# Patient Record
Sex: Male | Born: 1990 | Race: Black or African American | Hispanic: No | Marital: Single | Smoking: Current every day smoker
Health system: Southern US, Community
[De-identification: ages and names within clinical notes are randomized; demographics above are authoritative.]

## PROBLEM LIST (undated history)

## (undated) DIAGNOSIS — Z789 Other specified health status: Secondary | ICD-10-CM

## (undated) DIAGNOSIS — F209 Schizophrenia, unspecified: Secondary | ICD-10-CM

---
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
## 2010-07-10 ENCOUNTER — Emergency Department (HOSPITAL_COMMUNITY)
Admission: EM | Admit: 2010-07-10 | Discharge: 2010-07-10 | Disposition: A | Discharge status: Home / Self Care | Payer: Medicaid Other | Attending: Emergency Medicine | Admitting: Emergency Medicine

## 2010-07-10 ENCOUNTER — Emergency Department (HOSPITAL_COMMUNITY): Payer: Medicaid Other

## 2010-07-10 DIAGNOSIS — Y998 Other external cause status: Secondary | ICD-10-CM | POA: Insufficient documentation

## 2010-07-10 DIAGNOSIS — X58XXXA Exposure to other specified factors, initial encounter: Secondary | ICD-10-CM | POA: Insufficient documentation

## 2010-07-10 DIAGNOSIS — Y9389 Activity, other specified: Secondary | ICD-10-CM | POA: Insufficient documentation

## 2010-07-10 DIAGNOSIS — S63509A Unspecified sprain of unspecified wrist, initial encounter: Secondary | ICD-10-CM | POA: Insufficient documentation

## 2010-09-09 ENCOUNTER — Emergency Department (HOSPITAL_COMMUNITY)
Admission: EM | Admit: 2010-09-09 | Discharge: 2010-09-09 | Disposition: A | Discharge status: Home / Self Care | Payer: Medicaid Other | Attending: Emergency Medicine | Admitting: Emergency Medicine

## 2010-09-09 ENCOUNTER — Encounter: Payer: Self-pay | Admitting: *Deleted

## 2010-09-09 ENCOUNTER — Emergency Department (HOSPITAL_COMMUNITY): Payer: Medicaid Other

## 2010-09-09 DIAGNOSIS — R197 Diarrhea, unspecified: Secondary | ICD-10-CM | POA: Insufficient documentation

## 2010-09-09 DIAGNOSIS — F17200 Nicotine dependence, unspecified, uncomplicated: Secondary | ICD-10-CM | POA: Insufficient documentation

## 2010-09-09 DIAGNOSIS — R1084 Generalized abdominal pain: Secondary | ICD-10-CM

## 2010-09-09 DIAGNOSIS — R110 Nausea: Secondary | ICD-10-CM | POA: Insufficient documentation

## 2010-09-09 DIAGNOSIS — R109 Unspecified abdominal pain: Secondary | ICD-10-CM | POA: Insufficient documentation

## 2010-09-09 LAB — URINALYSIS, ROUTINE W REFLEX MICROSCOPIC
Ketones, ur: NEGATIVE mg/dL
Urobilinogen, UA: 0.2 mg/dL (ref 0.0–1.0)

## 2010-09-09 LAB — BASIC METABOLIC PANEL
CO2: 28 mEq/L (ref 19–32)
Calcium: 9.7 mg/dL (ref 8.4–10.5)
Creatinine, Ser: 0.92 mg/dL (ref 0.50–1.35)
GFR calc non Af Amer: >60 mL/min (ref >60)
Potassium: 3.4 mEq/L — ABNORMAL LOW (ref 3.5–5.1)
Sodium: 136 mEq/L (ref 135–145)

## 2010-09-09 LAB — CBC
MCH: 29.8 pg (ref 26.0–34.0)
MCHC: 34.2 g/dL (ref 30.0–36.0)
MCV: 87.2 fL (ref 78.0–100.0)
RDW: 12.9 % (ref 11.5–15.5)
WBC: 9.1 10*3/uL (ref 4.0–10.5)

## 2010-09-09 MED ORDER — SODIUM CHLORIDE 0.9 % IV SOLN
Freq: Once | INTRAVENOUS | Status: AC
  Administered 2010-09-09: 06:00:00 via INTRAVENOUS

## 2010-09-09 MED ORDER — PANTOPRAZOLE SODIUM 40 MG IV SOLR
40.0000 mg | Freq: Once | INTRAVENOUS | Status: AC
  Administered 2010-09-09: 40 mg via INTRAVENOUS
  Filled 2010-09-09: qty 40

## 2010-09-09 MED ORDER — ONDANSETRON HCL 4 MG/2ML IJ SOLN
4.0000 mg | Freq: Once | INTRAMUSCULAR | Status: AC
  Administered 2010-09-09: 4 mg via INTRAVENOUS
  Filled 2010-09-09: qty 2

## 2010-09-09 MED ORDER — MORPHINE SULFATE 2 MG/ML IJ SOLN
2.0000 mg | Freq: Once | INTRAMUSCULAR | Status: AC
  Administered 2010-09-09: 2 mg via INTRAVENOUS
  Filled 2010-09-09: qty 1

## 2010-09-09 NOTE — ED Notes (Signed)
Pt c/o abdominal pain, nausea, dizziness, and sob x 2days.

## 2010-09-09 NOTE — ED Provider Notes (Addendum)
History     CSN: [id number] Arrival date & time: 09/09/2010  5:11 AM  Chief Complaint  Patient presents with  . Abdominal Pain  . Shortness of Breath  . Nausea  . Dizziness   HPI Comments: Seen 0611.  Patient is a 20 y.o. male presenting with abdominal pain and shortness of breath.  Abdominal Pain The primary symptoms of the illness include abdominal pain, nausea and diarrhea. The primary symptoms of the illness do not include shortness of breath or vomiting. Episode onset: Has had intermittant abdominal pain x 3 days, worse tonight after eating at 1 AM. The onset of the illness was gradual. The problem has been gradually worsening.  The abdominal pain radiates to the periumbilical region. The severity of the abdominal pain is 6/10. The abdominal pain is relieved by nothing. The abdominal pain is exacerbated by deep breathing.  The nausea is associated with eating.  Symptoms associated with the illness do not include chills, constipation, urgency, hematuria, frequency or back pain.  Shortness of Breath  Pertinent negatives include no shortness of breath.    History reviewed. No pertinent past medical history.  History reviewed. No pertinent past surgical history.  Family History  Problem Relation Age of Onset  . Diabetes Mother     History  Substance Use Topics  . Smoking status: Current Everyday Smoker  . Smokeless tobacco: Not on file  . Alcohol Use: No      Review of Systems  Constitutional: Negative for chills.  Respiratory: Negative for shortness of breath.   Gastrointestinal: Positive for nausea, abdominal pain and diarrhea. Negative for vomiting and constipation.  Genitourinary: Negative for urgency, frequency and hematuria.  Musculoskeletal: Negative for back pain.  All other systems reviewed and are negative.    Physical Exam  BP 138/74  Pulse 100  Temp 97.5 F (36.4 C)  Resp 20  Ht 5\' 11"  (1.803 m)  Wt 169 lb (76.658 kg)  BMI 23.57 kg/m2  SpO2  99%  Physical Exam  Constitutional: He is oriented to person, place, and time. He appears well-developed and well-nourished.  HENT:  Head: Normocephalic and atraumatic.  Eyes: EOM are normal.  Neck: Neck supple.  Cardiovascular: Normal rate, normal heart sounds and intact distal pulses.   Pulmonary/Chest: Effort normal and breath sounds normal.  Abdominal: Soft. Bowel sounds are normal. He exhibits no mass. There is tenderness.       Mild tenderness to periumbilical area, no rebound, no guarding  Neurological: He is alert and oriented to person, place, and time.  Skin: Skin is warm and dry.    ED Course  Procedures  MDM Reviewed results with patient. Pt feels improved after observation and/or treatment in ED. MDM Reviewed: nursing note and vitals Interpretation: labs and x-ray        Nicoletta Dress. Colon Branch, MD 09/09/10 0745  Nicoletta Dress. Colon Branch, MD 09/09/10 [id number]

## 2010-09-09 NOTE — ED Notes (Signed)
Abd pain with nausea for 2 days.  Vomited x1 today, self induced.  Diarrhea x1  Today.

## 2010-09-09 NOTE — ED Notes (Signed)
Feels dizzy when stands

## 2010-09-13 ENCOUNTER — Emergency Department (HOSPITAL_COMMUNITY)
Admission: EM | Admit: 2010-09-13 | Discharge: 2010-09-13 | Disposition: A | Discharge status: Home / Self Care | Payer: Medicaid Other | Attending: Emergency Medicine | Admitting: Emergency Medicine

## 2010-09-13 ENCOUNTER — Emergency Department (HOSPITAL_COMMUNITY): Payer: Medicaid Other

## 2010-09-13 ENCOUNTER — Encounter (HOSPITAL_COMMUNITY): Payer: Self-pay | Admitting: *Deleted

## 2010-09-13 DIAGNOSIS — E119 Type 2 diabetes mellitus without complications: Secondary | ICD-10-CM | POA: Insufficient documentation

## 2010-09-13 DIAGNOSIS — F17200 Nicotine dependence, unspecified, uncomplicated: Secondary | ICD-10-CM | POA: Insufficient documentation

## 2010-09-13 DIAGNOSIS — S93409A Sprain of unspecified ligament of unspecified ankle, initial encounter: Secondary | ICD-10-CM | POA: Insufficient documentation

## 2010-09-13 DIAGNOSIS — Y9239 Other specified sports and athletic area as the place of occurrence of the external cause: Secondary | ICD-10-CM | POA: Insufficient documentation

## 2010-09-13 DIAGNOSIS — Y92838 Other recreation area as the place of occurrence of the external cause: Secondary | ICD-10-CM | POA: Insufficient documentation

## 2010-09-13 DIAGNOSIS — X500XXA Overexertion from strenuous movement or load, initial encounter: Secondary | ICD-10-CM | POA: Insufficient documentation

## 2010-09-13 MED ORDER — IBUPROFEN 800 MG PO TABS
800.0000 mg | ORAL_TABLET | Freq: Once | ORAL | Status: AC
  Administered 2010-09-13: 800 mg via ORAL

## 2010-09-13 MED ORDER — ONDANSETRON HCL 4 MG PO TABS
4.0000 mg | ORAL_TABLET | Freq: Once | ORAL | Status: AC
  Administered 2010-09-13: 4 mg via ORAL

## 2010-09-13 MED ORDER — IBUPROFEN 800 MG PO TABS: 800.0000 mg | ORAL_TABLET | Freq: Three times a day (TID) | ORAL | Status: AC

## 2010-09-13 MED ORDER — HYDROCODONE-ACETAMINOPHEN 5-325 MG PO TABS
2.0000 | ORAL_TABLET | Freq: Once | ORAL | Status: AC
  Administered 2010-09-13: 2 via ORAL

## 2010-09-13 MED ORDER — HYDROCODONE-ACETAMINOPHEN 5-325 MG PO TABS: ORAL_TABLET | ORAL | Status: DC

## 2010-09-13 NOTE — ED Notes (Signed)
Pt states he was playing basketball earlier this evening and twisted his right ankle

## 2010-09-13 NOTE — ED Notes (Signed)
Pt left er stating no needs 

## 2010-09-13 NOTE — ED Provider Notes (Signed)
History     CSN: [id number] Arrival date & time: 09/13/2010 11:01 PM  Chief Complaint  Patient presents with  . Ankle Pain   Patient is a 20 y.o. male presenting with ankle pain. The history is provided by the patient.  Ankle Pain  The incident occurred 1 to 2 hours ago. The incident occurred at the gym. Injury mechanism: twisting injury. The pain is present in the right ankle. The quality of the pain is described as sharp. The pain is at a severity of 7/10. The pain is moderate. The pain has been constant since onset. Associated symptoms include inability to bear weight. Pertinent negatives include no numbness and no loss of sensation. The symptoms are aggravated by bearing weight. He has tried nothing for the symptoms.    History reviewed. No pertinent past medical history.  History reviewed. No pertinent past surgical history.  Family History  Problem Relation Age of Onset  . Diabetes Mother     History  Substance Use Topics  . Smoking status: Current Everyday Smoker  . Smokeless tobacco: Not on file  . Alcohol Use: No      Review of Systems  Constitutional: Negative for activity change.       All ROS Neg except as noted in HPI  HENT: Negative for nosebleeds and neck pain.   Eyes: Negative for photophobia and discharge.  Respiratory: Negative for cough, shortness of breath and wheezing.   Cardiovascular: Negative for chest pain and palpitations.  Gastrointestinal: Negative for abdominal pain and blood in stool.  Genitourinary: Negative for dysuria, frequency and hematuria.  Musculoskeletal: Negative for back pain and arthralgias.  Skin: Negative.   Neurological: Negative for dizziness, seizures, speech difficulty and numbness.  Psychiatric/Behavioral: Negative for hallucinations and confusion.    Physical Exam  BP 113/66  Pulse 50  Temp(Src) 97.4 F (36.3 C) (Oral)  Resp 14  Ht 5\' 11"  (1.803 m)  Wt 169 lb (76.658 kg)  BMI 23.57 kg/m2  SpO2 100%  Physical  Exam  Nursing note and vitals reviewed. Constitutional: He is oriented to person, place, and time. He appears well-developed and well-nourished.  Non-toxic appearance.  HENT:  Head: Normocephalic.  Right Ear: Tympanic membrane and external ear normal.  Left Ear: Tympanic membrane and external ear normal.  Eyes: EOM and lids are normal. Pupils are equal, round, and reactive to light.  Neck: Normal range of motion. Neck supple. Carotid bruit is not present.  Cardiovascular: Normal rate, regular rhythm, normal heart sounds, intact distal pulses and normal pulses.   Pulmonary/Chest: Breath sounds normal. No respiratory distress.  Abdominal: Soft. Bowel sounds are normal. There is no tenderness. There is no guarding.  Musculoskeletal: Normal range of motion.       Pain and mild swelling of the lateral malleolus. Good cap refill. Pt can flex an extend all toes. Achilles intact. DP and PT wnl. FROM of the right knee and hip.  Lymphadenopathy:       Head (right side): No submandibular adenopathy present.       Head (left side): No submandibular adenopathy present.    He has no cervical adenopathy.  Neurological: He is alert and oriented to person, place, and time. He has normal strength. No cranial nerve deficit or sensory deficit.  Skin: Skin is warm and dry.  Psychiatric: He has a normal mood and affect. His speech is normal.    ED Course Plan discussed with pt. He verbalizes understanding of the plan.  Procedures Plan ASO,  crutches. Ice. Elevation. NSAID and narcotic pain med. MDM I have reviewed nursing notes, vital signs, and all appropriate lab and imaging results for this patient.      Kathie Dike, [place] 09/13/10 [phone]

## 2010-09-13 NOTE — ED Notes (Signed)
aso applied crutch education completed, pt hx of same injury and use of both devises

## 2010-09-25 ENCOUNTER — Emergency Department (HOSPITAL_COMMUNITY): Payer: Medicaid Other

## 2010-09-25 ENCOUNTER — Emergency Department (HOSPITAL_COMMUNITY)
Admission: EM | Admit: 2010-09-25 | Discharge: 2010-09-25 | Disposition: A | Discharge status: Home / Self Care | Payer: Medicaid Other | Attending: Emergency Medicine | Admitting: Emergency Medicine

## 2010-09-25 ENCOUNTER — Encounter (HOSPITAL_COMMUNITY): Payer: Self-pay | Admitting: Emergency Medicine

## 2010-09-25 DIAGNOSIS — X500XXA Overexertion from strenuous movement or load, initial encounter: Secondary | ICD-10-CM | POA: Insufficient documentation

## 2010-09-25 DIAGNOSIS — F17200 Nicotine dependence, unspecified, uncomplicated: Secondary | ICD-10-CM | POA: Insufficient documentation

## 2010-09-25 DIAGNOSIS — M25469 Effusion, unspecified knee: Secondary | ICD-10-CM | POA: Insufficient documentation

## 2010-09-25 DIAGNOSIS — M25569 Pain in unspecified knee: Secondary | ICD-10-CM | POA: Insufficient documentation

## 2010-09-25 MED ORDER — IBUPROFEN 800 MG PO TABS: 800.0000 mg | ORAL_TABLET | Freq: Three times a day (TID) | ORAL | Status: AC

## 2010-09-25 MED ORDER — IBUPROFEN 800 MG PO TABS
800.0000 mg | ORAL_TABLET | Freq: Once | ORAL | Status: AC
Start: 2010-09-25 — End: 2010-09-25
  Administered 2010-09-25: 800 mg via ORAL
  Filled 2010-09-25: qty 1

## 2010-09-25 NOTE — ED Provider Notes (Signed)
History     CSN: [id number] Arrival date & time: 09/25/2010  1:12 AM  Chief Complaint  Patient presents with  . Knee Pain   HPI Comments: Seen 0222. Patient states he was lifting a heavy object when he twisted his left knee. He heard a pop and had pain to the knee. Now each time he bends his knee, he experiences discomfort,   Patient is a 20 y.o. male presenting with knee pain. The history is provided by the patient.  Knee Pain This is a new problem. The current episode started 6 to 12 hours ago. The problem occurs constantly. The problem has not changed since onset.The symptoms are aggravated by walking and bending. The symptoms are relieved by nothing. He has tried nothing for the symptoms.    History reviewed. No pertinent past medical history.  History reviewed. No pertinent past surgical history.  Family History  Problem Relation Age of Onset  . Diabetes Mother     History  Substance Use Topics  . Smoking status: Current Everyday Smoker -- 0.5 packs/day    Types: Cigarettes  . Smokeless tobacco: Not on file  . Alcohol Use: No      Review of Systems  All other systems reviewed and are negative.    Physical Exam  BP 123/73  Pulse 74  Temp(Src) 98.5 F (36.9 C) (Oral)  Resp 18  Ht 5\' 11"  (1.803 m)  Wt 169 lb (76.658 kg)  BMI 23.57 kg/m2  SpO2 100%  Physical Exam  Nursing note and vitals reviewed. Constitutional: He is oriented to person, place, and time. He appears well-developed and well-nourished.  HENT:  Head: Normocephalic and atraumatic.  Eyes: EOM are normal.  Neck: Normal range of motion. Neck supple.  Cardiovascular: Normal rate.   Pulmonary/Chest: Effort normal.  Abdominal: Soft.  Musculoskeletal: Normal range of motion.       Left knee with mild effusion and mild crepitus. FROM.  Neurological: He is alert and oriented to person, place, and time. He has normal reflexes.  Skin: Skin is warm and dry.    ED Course  Procedures  Patient with  knee pain due to twisting motion while lifting heavy object. Patient with mild effusion. Placed in a knee immobilizer and given ibuprofen. Pt stable in ED with no significant deterioration in condition.Referral to ortho should pain continue.  Dg Knee Complete 4 Views Left  09/25/2010  *RADIOLOGY REPORT*  Clinical Data: Injury.  Lifting object.  Knee twisted and popped.  LEFT KNEE - COMPLETE 4+ VIEW  Comparison: None.  Findings: There is a joint effusion.  No evidence for acute fracture or subluxation. If pain persists, consider follow-up MRI to evaluate for internal derangement.  IMPRESSION: Joint effusion.  Original Report Authenticated By: Patterson Hammersmith, M.D.   MDM Reviewed: nursing note and vitals Interpretation: x-ray      Nicoletta Dress. Colon Branch, MD 09/25/10 [phone]

## 2010-09-25 NOTE — ED Notes (Signed)
Patient states was lifting something and bent his left knee the wrong way and now c/o increased pain.

## 2010-10-15 ENCOUNTER — Emergency Department (HOSPITAL_COMMUNITY)
Admission: EM | Admit: 2010-10-15 | Discharge: 2010-10-15 | Disposition: A | Discharge status: Home / Self Care | Payer: Medicaid Other | Attending: Emergency Medicine | Admitting: Emergency Medicine

## 2010-10-15 ENCOUNTER — Encounter (HOSPITAL_COMMUNITY): Payer: Self-pay | Admitting: *Deleted

## 2010-10-15 ENCOUNTER — Emergency Department (HOSPITAL_COMMUNITY): Payer: Medicaid Other

## 2010-10-15 DIAGNOSIS — S8392XA Sprain of unspecified site of left knee, initial encounter: Secondary | ICD-10-CM

## 2010-10-15 DIAGNOSIS — Z880 Allergy status to penicillin: Secondary | ICD-10-CM | POA: Insufficient documentation

## 2010-10-15 DIAGNOSIS — IMO0002 Reserved for concepts with insufficient information to code with codable children: Secondary | ICD-10-CM | POA: Insufficient documentation

## 2010-10-15 DIAGNOSIS — F17200 Nicotine dependence, unspecified, uncomplicated: Secondary | ICD-10-CM | POA: Insufficient documentation

## 2010-10-15 MED ORDER — TETANUS-DIPHTH-ACELL PERTUSSIS 5-2.5-18.5 LF-MCG/0.5 IM SUSP
0.5000 mL | Freq: Once | INTRAMUSCULAR | Status: AC
  Administered 2010-10-15: 0.5 mL via INTRAMUSCULAR
  Filled 2010-10-15: qty 0.5

## 2010-10-15 MED ORDER — OXYCODONE-ACETAMINOPHEN 5-325 MG PO TABS: 1.0000 | ORAL_TABLET | ORAL | Status: AC | PRN

## 2010-10-15 MED ORDER — BACITRACIN-NEOMYCIN-POLYMYXIN 400-5-5000 EX OINT
TOPICAL_OINTMENT | Freq: Once | CUTANEOUS | Status: AC
  Administered 2010-10-15: 04:00:00 via TOPICAL
  Filled 2010-10-15: qty 1

## 2010-10-15 MED ORDER — OXYCODONE-ACETAMINOPHEN 5-325 MG PO TABS
1.0000 | ORAL_TABLET | Freq: Once | ORAL | Status: AC
  Administered 2010-10-15: 1 via ORAL
  Filled 2010-10-15: qty 1

## 2010-10-15 NOTE — ED Notes (Signed)
Pt c/o left knee pain.
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
## 2010-10-24 NOTE — ED Provider Notes (Signed)
Medical screening examination/treatment/procedure(s) were performed by non-physician practitioner and as supervising physician I was immediately available for consultation/collaboration.  Nicoletta Dress. Colon Branch, MD 10/24/10 [phone]

## 2010-11-04 DIAGNOSIS — F17200 Nicotine dependence, unspecified, uncomplicated: Secondary | ICD-10-CM | POA: Insufficient documentation

## 2010-11-04 DIAGNOSIS — M25569 Pain in unspecified knee: Secondary | ICD-10-CM | POA: Insufficient documentation

## 2010-11-04 DIAGNOSIS — X58XXXA Exposure to other specified factors, initial encounter: Secondary | ICD-10-CM | POA: Insufficient documentation

## 2010-11-05 ENCOUNTER — Encounter (HOSPITAL_COMMUNITY): Payer: Self-pay | Admitting: *Deleted

## 2010-11-05 ENCOUNTER — Emergency Department (HOSPITAL_COMMUNITY)
Admission: EM | Admit: 2010-11-05 | Discharge: 2010-11-05 | Disposition: A | Discharge status: Home / Self Care | Payer: Medicaid Other | Attending: Emergency Medicine | Admitting: Emergency Medicine

## 2010-11-05 ENCOUNTER — Emergency Department (HOSPITAL_COMMUNITY): Payer: Medicaid Other

## 2010-11-05 DIAGNOSIS — M25569 Pain in unspecified knee: Secondary | ICD-10-CM

## 2010-11-05 MED ORDER — IBUPROFEN 800 MG PO TABS
800.0000 mg | ORAL_TABLET | Freq: Once | ORAL | Status: AC
  Administered 2010-11-05: 800 mg via ORAL
  Filled 2010-11-05: qty 1

## 2010-11-05 MED ORDER — IBUPROFEN 800 MG PO TABS: 800.0000 mg | ORAL_TABLET | Freq: Three times a day (TID) | ORAL | Status: AC

## 2010-11-05 NOTE — ED Notes (Signed)
Pt reports he has had pain in rt knee for a while, reports he was playing football today and now his rt knee hurts worse

## 2010-11-05 NOTE — ED Provider Notes (Signed)
History     CSN: [id number] Arrival date & time: 11/05/2010 12:12 AM  Chief Complaint  Patient presents with  . Knee Pain    (Consider location/radiation/quality/duration/timing/severity/associated sxs/prior treatment) HPI Comments: Seen 0044. Patient with knee pain since playing football with a church group. He has not taken any medicines or measures to improve the pain. It hurts worse when he is walking.  Patient is a 20 y.o. male presenting with knee pain. The history is provided by the patient.  Knee Pain This is a new problem. The current episode started 6 to 12 hours ago (Patient was playing foot ball with his curhch groun and developed left knee pain.). The problem occurs constantly. The problem has not changed since onset.Pertinent negatives include no chest pain, no abdominal pain, no headaches and no shortness of breath. Exacerbated by: walking. The symptoms are relieved by nothing. He has tried nothing for the symptoms.    History reviewed. No pertinent past medical history.  History reviewed. No pertinent past surgical history.  Family History  Problem Relation Age of Onset  . Diabetes Mother     History  Substance Use Topics  . Smoking status: Current Everyday Smoker -- 0.5 packs/day    Types: Cigarettes  . Smokeless tobacco: Not on file  . Alcohol Use: No      Review of Systems  Respiratory: Negative for shortness of breath.   Cardiovascular: Negative for chest pain.  Gastrointestinal: Negative for abdominal pain.  Neurological: Negative for headaches.  All other systems reviewed and are negative.    Allergies  Penicillins  Home Medications  No current outpatient prescriptions on file.  BP 115/63  Pulse 74  Temp(Src) 98.3 F (36.8 C) (Oral)  Resp 20  Ht 5\' 11"  (1.803 m)  Wt 166 lb 11.2 oz (75.615 kg)  BMI 23.25 kg/m2  SpO2 99%  Physical Exam  Nursing note and vitals reviewed. Constitutional: He is oriented to person, place, and time. He  appears well-developed and well-nourished. No distress.  HENT:  Head: Normocephalic and atraumatic.  Eyes: EOM are normal.  Neck: Normal range of motion.  Cardiovascular: Normal rate, normal heart sounds and intact distal pulses.   Pulmonary/Chest: Effort normal and breath sounds normal.  Musculoskeletal:       Left knee with FROM, no effusion, no crepitus, no deformity. Pulses 2+.  Neurological: He is alert and oriented to person, place, and time. He has normal reflexes.  Skin: Skin is warm and dry.    ED Course  Procedures (including critical care time)  Labs Reviewed - No data to display Dg Knee Complete 4 Views Left  11/05/2010  *RADIOLOGY REPORT*  Clinical Data: Left knee pain and stiffness after playing football, no specific injury  LEFT KNEE - COMPLETE 4+ VIEW  Comparison: 10/15/2010  Findings: Bone mineralization normal. Joint spaces preserved. No fracture, dislocation, or bone destruction. No joint effusion.  IMPRESSION: Normal exam.  Original Report Authenticated By: Lollie Marrow, M.D.       MDM  Patient with knee injury playing football. PE is unremarkable, Xray is normal. Reviewed film and use of antiinflammatories with the patient.Pt stable in ED with no significant deterioration in condition. MDM Reviewed: nursing note and vitals Interpretation: x-ray           Nicoletta Dress. Colon Branch, MD 11/05/10 [phone]

## 2011-02-20 ENCOUNTER — Emergency Department (HOSPITAL_COMMUNITY)
Admission: EM | Admit: 2011-02-20 | Discharge: 2011-02-21 | Disposition: A | Discharge status: Home / Self Care | Payer: Medicaid Other | Attending: Emergency Medicine | Admitting: Emergency Medicine

## 2011-02-20 ENCOUNTER — Encounter (HOSPITAL_COMMUNITY): Payer: Self-pay | Admitting: *Deleted

## 2011-02-20 ENCOUNTER — Emergency Department (HOSPITAL_COMMUNITY): Payer: Medicaid Other

## 2011-02-20 DIAGNOSIS — S93401A Sprain of unspecified ligament of right ankle, initial encounter: Secondary | ICD-10-CM

## 2011-02-20 DIAGNOSIS — W108XXA Fall (on) (from) other stairs and steps, initial encounter: Secondary | ICD-10-CM | POA: Insufficient documentation

## 2011-02-20 DIAGNOSIS — F17200 Nicotine dependence, unspecified, uncomplicated: Secondary | ICD-10-CM | POA: Insufficient documentation

## 2011-02-20 DIAGNOSIS — S93409A Sprain of unspecified ligament of unspecified ankle, initial encounter: Secondary | ICD-10-CM | POA: Insufficient documentation

## 2011-02-20 DIAGNOSIS — M25476 Effusion, unspecified foot: Secondary | ICD-10-CM | POA: Insufficient documentation

## 2011-02-20 DIAGNOSIS — M25473 Effusion, unspecified ankle: Secondary | ICD-10-CM | POA: Insufficient documentation

## 2011-02-20 DIAGNOSIS — M25579 Pain in unspecified ankle and joints of unspecified foot: Secondary | ICD-10-CM | POA: Insufficient documentation

## 2011-02-20 MED ORDER — HYDROCODONE-ACETAMINOPHEN 5-325 MG PO TABS
2.0000 | ORAL_TABLET | Freq: Once | ORAL | Status: AC
  Administered 2011-02-20: 2 via ORAL
  Filled 2011-02-20: qty 2

## 2011-02-20 MED ORDER — IBUPROFEN 800 MG PO TABS: 800.0000 mg | ORAL_TABLET | Freq: Three times a day (TID) | ORAL | Status: AC

## 2011-02-20 MED ORDER — ONDANSETRON HCL 4 MG PO TABS
4.0000 mg | ORAL_TABLET | Freq: Once | ORAL | Status: AC
  Administered 2011-02-20: 4 mg via ORAL
  Filled 2011-02-20: qty 1

## 2011-02-20 MED ORDER — IBUPROFEN 800 MG PO TABS
800.0000 mg | ORAL_TABLET | Freq: Once | ORAL | Status: AC
  Administered 2011-02-20: 800 mg via ORAL
  Filled 2011-02-20: qty 1

## 2011-02-20 MED ORDER — HYDROCODONE-ACETAMINOPHEN 5-325 MG PO TABS: 1.0000 | ORAL_TABLET | ORAL | Status: AC | PRN

## 2011-02-20 NOTE — ED Provider Notes (Signed)
History     CSN: [id number]  Arrival date & time 02/20/11  2235   First MD Initiated Contact with Patient 02/20/11 2305      Chief Complaint  Patient presents with  . Ankle Pain    (Consider location/radiation/quality/duration/timing/severity/associated sxs/prior treatment) Patient is a 21 y.o. male presenting with ankle pain. The history is provided by the patient.  Ankle Pain  The incident occurred 1 to 2 hours ago. The injury mechanism was a fall. The pain is present in the right ankle. The pain is moderate. The pain has been worsening since onset. Associated symptoms include inability to bear weight. Pertinent negatives include no numbness. He reports no foreign bodies present. The symptoms are aggravated by bearing weight. He has tried nothing for the symptoms.    History reviewed. No pertinent past medical history.  History reviewed. No pertinent past surgical history.  Family History  Problem Relation Age of Onset  . Diabetes Mother     History  Substance Use Topics  . Smoking status: Current Everyday Smoker -- 0.5 packs/day    Types: Cigarettes  . Smokeless tobacco: Not on file  . Alcohol Use: No      Review of Systems  Constitutional: Negative for activity change.       All ROS Neg except as noted in HPI  HENT: Negative for nosebleeds and neck pain.   Eyes: Negative for photophobia and discharge.  Respiratory: Negative for cough, shortness of breath and wheezing.   Cardiovascular: Negative for chest pain and palpitations.  Gastrointestinal: Negative for abdominal pain and blood in stool.  Genitourinary: Negative for dysuria, frequency and hematuria.  Musculoskeletal: Negative for back pain and arthralgias.  Skin: Negative.   Neurological: Negative for dizziness, seizures, speech difficulty and numbness.  Psychiatric/Behavioral: Negative for hallucinations and confusion.    Allergies  Penicillins  Home Medications  No current outpatient prescriptions  on file.  BP 109/68  Pulse 100  Temp(Src) 98.2 F (36.8 C) (Oral)  Resp 20  Ht 5\' 10"  (1.778 m)  Wt 165 lb (74.844 kg)  BMI 23.68 kg/m2  SpO2 100%  Physical Exam  Nursing note and vitals reviewed. Constitutional: He is oriented to person, place, and time. He appears well-developed and well-nourished.  Non-toxic appearance.  HENT:  Head: Normocephalic.  Right Ear: Tympanic membrane and external ear normal.  Left Ear: Tympanic membrane and external ear normal.  Eyes: EOM and lids are normal. Pupils are equal, round, and reactive to light.  Neck: Normal range of motion. Neck supple. Carotid bruit is not present.  Cardiovascular: Normal rate, regular rhythm, normal heart sounds, intact distal pulses and normal pulses.   Pulmonary/Chest: Breath sounds normal. No respiratory distress.  Abdominal: Soft. Bowel sounds are normal. There is no tenderness. There is no guarding.  Musculoskeletal: Normal range of motion.       Pain and swelling of the right lateral malleolus. Dorsalis pedis and posterior tibial pulses are intact. Achilles tendon is intact. Good capillary refill noted. Full range of motion of the right knee and hip.  Lymphadenopathy:       Head (right side): No submandibular adenopathy present.       Head (left side): No submandibular adenopathy present.    He has no cervical adenopathy.  Neurological: He is alert and oriented to person, place, and time. He has normal strength. No cranial nerve deficit or sensory deficit.  Skin: Skin is warm and dry.  Psychiatric: He has a normal mood and affect. His  speech is normal.    ED Course  Procedures (including critical care time) Pulse oximetry 100% on room air. Within normal limits by my interpretation. Labs Reviewed - No data to display Dg Ankle Complete Right  02/20/2011  *RADIOLOGY REPORT*  Clinical Data: Right lateral ankle pain and swelling after fall  RIGHT ANKLE - COMPLETE 3+ VIEW  Comparison: 09/13/2010  Findings: Prompt  lateral soft tissue swelling.  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  No evidence of acute fracture or subluxation.  The ankle mortis and talar dome appear intact.  No focal bone lesion or bone destruction.  Bone cortex and trabecular architecture appear intact.  No abnormal periosteal reaction.  No radiopaque foreign bodies in the soft tissues.  IMPRESSION: Lateral soft tissue swelling.  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  No evidence of acute fracture or subluxation.  Original Report Authenticated By: Marlon Pel, M.D.     Dx: right ankle sprain.   MDM  I have reviewed nursing notes, vital signs, and all appropriate lab and imaging results for this patient. Negative x-ray report discussed with the patient. Patient fitted with an ankle splint, crutches and ice pack daily. Prescription for ibuprofen 800, and Norco 5 mg given to the patient. Patient is to see the orthopedist if not improving.       Kathie Dike, [place] 02/20/11 [phone]

## 2011-02-20 NOTE — ED Notes (Signed)
Pt reports he was attempting a front flip and rolled his rt ankle, reports he heard a "pop"

## 2011-02-21 NOTE — ED Notes (Signed)
Discharge instructions given and reviewed with patient.  Prescriptions given for Hydrocodone and Motrin; effects and use explained.  Patient verbalized understanding of sedating effects of Hydrocodone and to take Motrin with food.  Crutch fitting and teaching completed by A Dillard, EDT. Patient ambulatory with assistance of crutches.  Friend accompanied discharge to drive home.

## 2011-02-23 NOTE — ED Provider Notes (Signed)
Medical screening examination/treatment/procedure(s) were performed by non-physician practitioner and as supervising physician I was immediately available for consultation/collaboration.   Laray Anger, DO 02/23/11 [phone]

## 2011-07-22 ENCOUNTER — Emergency Department (HOSPITAL_COMMUNITY)
Admission: EM | Admit: 2011-07-22 | Discharge: 2011-07-22 | Disposition: A | Discharge status: Home / Self Care | Payer: Medicaid Other | Attending: Emergency Medicine | Admitting: Emergency Medicine

## 2011-07-22 ENCOUNTER — Encounter (HOSPITAL_COMMUNITY): Payer: Self-pay

## 2011-07-22 DIAGNOSIS — L089 Local infection of the skin and subcutaneous tissue, unspecified: Secondary | ICD-10-CM

## 2011-07-22 DIAGNOSIS — F17200 Nicotine dependence, unspecified, uncomplicated: Secondary | ICD-10-CM | POA: Insufficient documentation

## 2011-07-22 DIAGNOSIS — L0889 Other specified local infections of the skin and subcutaneous tissue: Secondary | ICD-10-CM | POA: Insufficient documentation

## 2011-07-22 MED ORDER — SULFAMETHOXAZOLE-TRIMETHOPRIM 200-40 MG/5ML PO SUSP: 20.0000 mL | Freq: Once | ORAL | Status: DC

## 2011-07-22 MED ORDER — SULFAMETHOXAZOLE-TRIMETHOPRIM 800-160 MG PO TABS: 1.0000 | ORAL_TABLET | Freq: Two times a day (BID) | ORAL | Status: AC

## 2011-07-22 MED ORDER — SULFAMETHOXAZOLE-TMP DS 800-160 MG PO TABS
1.0000 | ORAL_TABLET | Freq: Once | ORAL | Status: AC
  Administered 2011-07-22: 1 via ORAL
  Filled 2011-07-22: qty 1

## 2011-07-22 NOTE — ED Provider Notes (Signed)
History     CSN: [id number]  Arrival date & time 07/22/11  0321   First MD Initiated Contact with Patient 07/22/11 0354      Chief Complaint  Patient presents with  . Recurrent Skin Infections    (Consider location/radiation/quality/duration/timing/severity/associated sxs/prior treatment) HPI Thomas Combs is a 21 y.o. male who presents to the Emergency Department complaining of infected pimple to his right cheek. Patient with acne who had a small raised red area two days ago that he squeezed and is now larger, erythematous and painful. Denies fever, chills.  History reviewed. No pertinent past medical history.  History reviewed. No pertinent past surgical history.  Family History  Problem Relation Age of Onset  . Diabetes Mother     History  Substance Use Topics  . Smoking status: Current Everyday Smoker -- 0.5 packs/day    Types: Cigarettes  . Smokeless tobacco: Not on file  . Alcohol Use: No      Review of Systems  Constitutional: Negative for fever.       10 Systems reviewed and are negative for acute change except as noted in the HPI.  HENT: Negative for congestion.   Eyes: Negative for discharge and redness.  Respiratory: Negative for cough and shortness of breath.   Cardiovascular: Negative for chest pain.  Gastrointestinal: Negative for vomiting and abdominal pain.  Musculoskeletal: Negative for back pain.  Skin: Negative for rash.       Infected pimple  Neurological: Negative for syncope, numbness and headaches.  Psychiatric/Behavioral:       No behavior change.    Allergies  Penicillins  Home Medications   Current Outpatient Rx  Name Route Sig Dispense Refill  . SULFAMETHOXAZOLE-TRIMETHOPRIM 800-160 MG PO TABS Oral Take 1 tablet by mouth 2 (two) times daily. 28 tablet 0    BP 126/58  Pulse 58  Temp 97.9 F (36.6 C) (Oral)  Resp 16  Ht 5\' 10"  (1.778 m)  Wt 160 lb (72.576 kg)  BMI 22.96 kg/m2  SpO2 99%  Physical Exam  Nursing note and  vitals reviewed. Constitutional: He appears well-developed and well-nourished.       Awake, alert, nontoxic appearance.  HENT:  Head: Atraumatic.  Eyes: Right eye exhibits no discharge. Left eye exhibits no discharge.  Neck: Neck supple.  Cardiovascular: Normal rate and normal heart sounds.   Pulmonary/Chest: Effort normal. He exhibits no tenderness.  Abdominal: Soft. There is no tenderness. There is no rebound.  Musculoskeletal: He exhibits no tenderness.       Baseline ROM, no obvious new focal weakness.  Neurological:       Mental status and motor strength appears baseline for patient and situation.  Skin: No rash noted.       Facial acne with an inflamed comedone to right cheek with surrounding erythema and swelling, infraorbital area. No fluctuance.   Psychiatric: He has a normal mood and affect.    ED Course  Procedures (including critical care time)    1. Skin infection       MDM  Patient with facial acne presents with inflamed comedone.Initiated antibiotic therapy. Pt stable in ED with no significant deterioration in condition.The patient appears reasonably screened and/or stabilized for discharge and I doubt any other medical condition or other [facility] requiring further screening, evaluation, or treatment in the ED at this time prior to discharge.  MDM Reviewed: nursing note and vitals           Nicoletta Dress. Colon Branch, MD 07/22/11  2056 

## 2011-07-22 NOTE — ED Notes (Signed)
Boil/swelling to right cheek area, states has gotten bigger and more redness

## 2011-07-22 NOTE — Discharge Instructions (Signed)
Apply hot compresses to the area several times a day.Take all of the antibiotic. Use tylenol or ibubrofen for discomfort.    Skin Infections A skin infection usually develops as a result of disruption of the skin barrier.  CAUSES  A skin infection might occur following:  Trauma or an injury to the skin such as a cut or insect sting.   Inflammation (as in eczema).   Breaks in the skin between the toes (as in athlete's foot).   Swelling (edema).  SYMPTOMS  The legs are the most common site affected. Usually there is:  Redness.   Swelling.   Pain.   There may be red streaks in the area of the infection.  TREATMENT   Minor skin infections may be treated with topical antibiotics, but if the skin infection is severe, hospital care and intravenous (IV) antibiotic treatment may be needed.   Most often skin infections can be treated with oral antibiotic medicine as well as proper rest and elevation of the affected area until the infection improves.   If you are prescribed oral antibiotics, it is important to take them as directed and to take all the pills even if you feel better before you have finished all of the medicine.   You may apply warm compresses to the area for 20-30 minutes 4 times daily.  You might need a tetanus shot now if:  You have no idea when you had the last one.   You have never had a tetanus shot before.   Your wound had dirt in it.  If you need a tetanus shot and you decide not to get one, there is a rare chance of getting tetanus. Sickness from tetanus can be serious. If you get a tetanus shot, your arm may swell and become red and warm at the shot site. This is common and not a problem. SEEK MEDICAL CARE IF:  The pain and swelling from your infection do not improve within 2 days.  SEEK IMMEDIATE MEDICAL CARE IF:  You develop a fever, chills, or other serious problems.  Document Released: 02/16/2004 Document Revised: 12/28/2010 Document Reviewed:  12/29/2007 [facility] Patient Information 2012 [place], [place].
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
## 2017-03-11 ENCOUNTER — Other Ambulatory Visit: Payer: Self-pay

## 2017-03-11 ENCOUNTER — Encounter (HOSPITAL_COMMUNITY): Payer: Self-pay | Admitting: Emergency Medicine

## 2017-03-11 DIAGNOSIS — Z5321 Procedure and treatment not carried out due to patient leaving prior to being seen by health care provider: Secondary | ICD-10-CM | POA: Insufficient documentation

## 2017-03-11 DIAGNOSIS — R109 Unspecified abdominal pain: Secondary | ICD-10-CM | POA: Insufficient documentation

## 2017-03-11 LAB — CBC
HCT: 47.1 % (ref 39.0–52.0)
Hemoglobin: 15.8 g/dL (ref 13.0–17.0)
MCH: 30.7 pg (ref 26.0–34.0)
MCHC: 33.5 g/dL (ref 30.0–36.0)
MCV: 91.5 fL (ref 78.0–100.0)
PLATELETS: 137 10*3/uL — AB (ref 150–400)
RBC: 5.15 MIL/uL (ref 4.22–5.81)
RDW: 13.3 % (ref 11.5–15.5)
WBC: 7.9 10*3/uL (ref 4.0–10.5)

## 2017-03-11 LAB — COMPREHENSIVE METABOLIC PANEL
ALK PHOS: 66 U/L (ref 38–126)
ALT: 29 U/L (ref 17–63)
ANION GAP: 12 (ref 5–15)
AST: 23 U/L (ref 15–41)
Albumin: 4.1 g/dL (ref 3.5–5.0)
BILIRUBIN TOTAL: 0.4 mg/dL (ref 0.3–1.2)
BUN: 8 mg/dL (ref 6–20)
CALCIUM: 9.3 mg/dL (ref 8.9–10.3)
CO2: 24 mmol/L (ref 22–32)
CREATININE: 0.84 mg/dL (ref 0.61–1.24)
Chloride: 102 mmol/L (ref 101–111)
GFR calc non Af Amer: >60 mL/min (ref >60)
GLUCOSE: 111 mg/dL — AB (ref 65–99)
Potassium: 3.4 mmol/L — ABNORMAL LOW (ref 3.5–5.1)
SODIUM: 138 mmol/L (ref 135–145)
Total Protein: 7.4 g/dL (ref 6.5–8.1)

## 2017-03-11 LAB — LIPASE, BLOOD: Lipase: 33 U/L (ref 11–51)

## 2017-03-11 NOTE — ED Triage Notes (Signed)
Pt c/o generalized abd pain since yesterday. Pt denies any n/v/d and states last normal bowel movement was yesterday.

## 2017-03-12 ENCOUNTER — Other Ambulatory Visit: Payer: Self-pay

## 2017-03-12 ENCOUNTER — Emergency Department (HOSPITAL_COMMUNITY)
Admission: EM | Admit: 2017-03-12 | Discharge: 2017-03-12 | Disposition: A | Discharge status: Home / Self Care | Payer: Self-pay | Attending: Emergency Medicine | Admitting: Emergency Medicine

## 2017-03-12 ENCOUNTER — Emergency Department (HOSPITAL_COMMUNITY): Payer: Self-pay

## 2017-03-12 ENCOUNTER — Encounter (HOSPITAL_COMMUNITY): Payer: Self-pay | Admitting: *Deleted

## 2017-03-12 DIAGNOSIS — F17210 Nicotine dependence, cigarettes, uncomplicated: Secondary | ICD-10-CM | POA: Insufficient documentation

## 2017-03-12 DIAGNOSIS — R1084 Generalized abdominal pain: Secondary | ICD-10-CM | POA: Insufficient documentation

## 2017-03-12 LAB — COMPREHENSIVE METABOLIC PANEL
ALT: 30 U/L (ref 17–63)
AST: 29 U/L (ref 15–41)
Albumin: 4.1 g/dL (ref 3.5–5.0)
Alkaline Phosphatase: 64 U/L (ref 38–126)
Anion gap: 11 (ref 5–15)
BUN: 9 mg/dL (ref 6–20)
CHLORIDE: 101 mmol/L (ref 101–111)
CO2: 26 mmol/L (ref 22–32)
Calcium: 9.1 mg/dL (ref 8.9–10.3)
Creatinine, Ser: 0.95 mg/dL (ref 0.61–1.24)
GFR calc non Af Amer: >60 mL/min (ref >60)
Glucose, Bld: 128 mg/dL — ABNORMAL HIGH (ref 65–99)
POTASSIUM: 3.8 mmol/L (ref 3.5–5.1)
SODIUM: 138 mmol/L (ref 135–145)
Total Bilirubin: 0.5 mg/dL (ref 0.3–1.2)
Total Protein: 7.4 g/dL (ref 6.5–8.1)

## 2017-03-12 LAB — CBC
HEMATOCRIT: 46 % (ref 39.0–52.0)
HEMOGLOBIN: 15.3 g/dL (ref 13.0–17.0)
MCH: 30.5 pg (ref 26.0–34.0)
MCHC: 33.3 g/dL (ref 30.0–36.0)
MCV: 91.6 fL (ref 78.0–100.0)
Platelets: 150 10*3/uL (ref 150–400)
RBC: 5.02 MIL/uL (ref 4.22–5.81)
RDW: 13.5 % (ref 11.5–15.5)
WBC: 8.7 10*3/uL (ref 4.0–10.5)

## 2017-03-12 LAB — LIPASE, BLOOD: Lipase: 30 U/L (ref 11–51)

## 2017-03-12 MED ORDER — SODIUM CHLORIDE 0.9 % IV BOLUS (SEPSIS)
1000.0000 mL | Freq: Once | INTRAVENOUS | Status: AC
  Administered 2017-03-12: 1000 mL via INTRAVENOUS

## 2017-03-12 MED ORDER — RANITIDINE HCL 150 MG PO TABS: 150.0000 mg | ORAL_TABLET | Freq: Two times a day (BID) | ORAL | 0 refills | Status: DC

## 2017-03-12 MED ORDER — DICYCLOMINE HCL 10 MG/ML IM SOLN
20.0000 mg | Freq: Once | INTRAMUSCULAR | Status: AC
  Administered 2017-03-12: 20 mg via INTRAMUSCULAR
  Filled 2017-03-12: qty 2

## 2017-03-12 MED ORDER — DICYCLOMINE HCL 20 MG PO TABS: 20.0000 mg | ORAL_TABLET | Freq: Three times a day (TID) | ORAL | 0 refills | Status: DC

## 2017-03-12 MED ORDER — IOPAMIDOL (ISOVUE-300) INJECTION 61%
100.0000 mL | Freq: Once | INTRAVENOUS | Status: AC | PRN
  Administered 2017-03-12: 100 mL via INTRAVENOUS

## 2017-03-12 NOTE — ED Triage Notes (Signed)
Pt c/o generalized abdominal pain x 2 days; pt denies any n/v/d

## 2017-03-12 NOTE — ED Notes (Signed)
Pt alert & oriented x4, stable gait. Patient given discharge instructions, paperwork & prescription(s). Patient  instructed to stop at the registration desk to finish any additional paperwork. Patient verbalized understanding. Pt left department w/ no further questions. 

## 2017-03-12 NOTE — ED Notes (Signed)
Called x1, no answer

## 2017-03-12 NOTE — ED Notes (Signed)
Called x2, no answer 

## 2017-03-12 NOTE — ED Notes (Signed)
Called x 3, no answer.  

## 2017-03-12 NOTE — ED Notes (Signed)
Pt advised of need for urine sample and pt provided with urinal.

## 2017-03-12 NOTE — ED Provider Notes (Signed)
[facility] Provider Note   CSN: [id number] Arrival date & time: 03/12/17  [id number]     History   Chief Complaint Chief Complaint  Patient presents with  . Abdominal Pain    HPI Thomas IdlerBruce A Combs is a 27 y.o. male.  Patient presents to the emergency department for evaluation of abdominal pain.  Symptoms began earlier today.  Patient reports diffuse pain and cramping of the abdomen.  Is not had associated fever, nausea, vomiting, diarrhea.  Patient has not identified any alleviating or exacerbating factors.  He has not tried any home medications.      History reviewed. No pertinent past medical history.  There are no active problems to display for this patient.   History reviewed. No pertinent surgical history.     Home Medications    Prior to Admission medications   Medication Sig Start Date End Date Taking? Authorizing Provider  dicyclomine (BENTYL) 20 MG tablet Take 1 tablet (20 mg total) by mouth 3 (three) times daily before meals. 03/12/17   Gilda CreasePollina, [place] Borchardt J, MD  ranitidine (ZANTAC) 150 MG tablet Take 1 tablet (150 mg total) by mouth 2 (two) times daily. 03/12/17   Gilda CreasePollina, Alvaretta Eisenberger J, MD    Family History Family History  Problem Relation Age of Onset  . Diabetes Mother     Social History Social History   Tobacco Use  . Smoking status: Current Every Day Smoker    Packs/day: 0.50    Types: Cigarettes  . Smokeless tobacco: Never Used  Substance Use Topics  . Alcohol use: Yes  . Drug use: No     Allergies   Penicillins   Review of Systems Review of Systems  Gastrointestinal: Positive for abdominal pain.  All other systems reviewed and are negative.    Physical Exam Updated Vital Signs BP (!) 147/82   Pulse 67   Temp 97.6 F (36.4 C) (Oral)   Resp 18   Ht 5\' 10"  (1.778 m)   Wt 86.2 kg (190 lb)   SpO2 100%   BMI 27.26 kg/m   Physical Exam  Constitutional: He is oriented to person, place, and time. He appears  well-developed and well-nourished. No distress.  HENT:  Head: Normocephalic and atraumatic.  Right Ear: Hearing normal.  Left Ear: Hearing normal.  Nose: Nose normal.  Mouth/Throat: Oropharynx is clear and moist and mucous membranes are normal.  Eyes: Conjunctivae and EOM are normal. Pupils are equal, round, and reactive to light.  Neck: Normal range of motion. Neck supple.  Cardiovascular: Regular rhythm, S1 normal and S2 normal. Exam reveals no gallop and no friction rub.  No murmur heard. Pulmonary/Chest: Effort normal and breath sounds normal. No respiratory distress. He exhibits no tenderness.  Abdominal: Soft. Normal appearance and bowel sounds are normal. There is no hepatosplenomegaly. There is generalized tenderness. There is no rebound, no guarding, no tenderness at McBurney's point and negative Murphy's sign. No hernia.  Musculoskeletal: Normal range of motion.  Neurological: He is alert and oriented to person, place, and time. He has normal strength. No cranial nerve deficit or sensory deficit. Coordination normal. GCS eye subscore is 4. GCS verbal subscore is 5. GCS motor subscore is 6.  Skin: Skin is warm, dry and intact. No rash noted. No cyanosis.  Psychiatric: He has a normal mood and affect. His speech is normal and behavior is normal. Thought content normal.  Nursing note and vitals reviewed.    ED Treatments / Results  Labs (all labs  ordered are listed, but only abnormal results are displayed) Labs Reviewed  COMPREHENSIVE METABOLIC PANEL - Abnormal; Notable for the following components:      Result Value   Glucose, Bld 128 (*)    All other components within normal limits  LIPASE, BLOOD  CBC  URINALYSIS, ROUTINE W REFLEX MICROSCOPIC    EKG  EKG Interpretation None       Radiology Ct Abdomen Pelvis W Contrast  Result Date: 03/12/2017 CLINICAL DATA:  Generalized abdominal pain. EXAM: CT ABDOMEN AND PELVIS WITH CONTRAST TECHNIQUE: Multidetector CT imaging  of the abdomen and pelvis was performed using the standard protocol following bolus administration of intravenous contrast. CONTRAST:  ISOVUE-300 IOPAMIDOL (ISOVUE-300) INJECTION 61% COMPARISON:  None. FINDINGS: Lower chest: No basilar pulmonary nodules or pleural effusion. No apical pericardial effusion. Hepatobiliary: Normal hepatic contours and density. No visible biliary dilatation. Normal gallbladder. Pancreas: Normal parenchymal contours without ductal dilatation. No peripancreatic fluid collection. Spleen: Normal. Adrenals/Urinary Tract: --Adrenal glands: Normal. --Right kidney/ureter: No hydronephrosis, perinephric stranding or nephrolithiasis. No obstructing ureteral stones. --Left kidney/ureter: No hydronephrosis, perinephric stranding or nephrolithiasis. No obstructing ureteral stones. --Urinary bladder: Normal appearance for the degree of distention. Stomach/Bowel: --Stomach/Duodenum: No hiatal hernia or other gastric abnormality. Normal duodenal course. --Small bowel: No dilatation or inflammation. --Colon: No focal abnormality. --Appendix: Normal. Vascular/Lymphatic: Normal course and caliber of the major abdominal vessels. No abdominal or pelvic lymphadenopathy. Reproductive: Normal prostate and seminal vesicles. Musculoskeletal. No bony spinal canal stenosis or focal osseous abnormality. Other: None. IMPRESSION: No acute abnormality of the abdomen or pelvis. Electronically Signed   By: Deatra Robinson M.D.   On: 03/12/2017 04:27    Procedures Procedures (including critical care time)  Medications Ordered in ED Medications  sodium chloride 0.9 % bolus 1,000 mL (1,000 mLs Intravenous New Bag/Given 03/12/17 0359)  dicyclomine (BENTYL) injection 20 mg (20 mg Intramuscular Given 03/12/17 0400)  iopamidol (ISOVUE-300) 61 % injection 100 mL (100 mLs Intravenous Contrast Given 03/12/17 0406)     Initial Impression / Assessment and Plan / ED Course  I have reviewed the triage vital signs and  the nursing notes.  Pertinent labs & imaging results that were available during my care of the patient were reviewed by me and considered in my medical decision making (see chart for details).     Presents with generalized abdominal pain and tenderness.  His workup is entirely normal.  This includes blood work and CT scan.  Patient reassured, will treat symptomatically.  Final Clinical Impressions(s) / ED Diagnoses   Final diagnoses:  Generalized abdominal pain    ED Discharge Orders        Ordered    ranitidine (ZANTAC) 150 MG tablet  2 times daily     03/12/17 0437    dicyclomine (BENTYL) 20 MG tablet  3 times daily before meals     03/12/17 0437       Gilda Crease, MD 03/12/17 [phone]

## 2018-07-14 ENCOUNTER — Encounter (HOSPITAL_COMMUNITY): Payer: Self-pay

## 2018-07-14 ENCOUNTER — Other Ambulatory Visit: Payer: Self-pay

## 2018-07-14 ENCOUNTER — Inpatient Hospital Stay (HOSPITAL_COMMUNITY)
Admission: AD | Admit: 2018-07-14 | Discharge: 2018-07-21 | DRG: 885 | Disposition: A | Discharge status: Home / Self Care | Payer: Federal, State, Local not specified - Other | Source: Intra-hospital | Attending: Psychiatry | Admitting: Psychiatry

## 2018-07-14 ENCOUNTER — Emergency Department (HOSPITAL_COMMUNITY)
Admission: EM | Admit: 2018-07-14 | Discharge: 2018-07-14 | Disposition: A | Discharge status: Other Acute Inpatient Hospital | Payer: Self-pay | Attending: Emergency Medicine | Admitting: Emergency Medicine

## 2018-07-14 DIAGNOSIS — F332 Major depressive disorder, recurrent severe without psychotic features: Secondary | ICD-10-CM | POA: Insufficient documentation

## 2018-07-14 DIAGNOSIS — K589 Irritable bowel syndrome without diarrhea: Secondary | ICD-10-CM | POA: Diagnosis present

## 2018-07-14 DIAGNOSIS — Z765 Malingerer [conscious simulation]: Secondary | ICD-10-CM

## 2018-07-14 DIAGNOSIS — F10220 Alcohol dependence with intoxication, uncomplicated: Secondary | ICD-10-CM

## 2018-07-14 DIAGNOSIS — Z833 Family history of diabetes mellitus: Secondary | ICD-10-CM | POA: Diagnosis not present

## 2018-07-14 DIAGNOSIS — F17210 Nicotine dependence, cigarettes, uncomplicated: Secondary | ICD-10-CM | POA: Insufficient documentation

## 2018-07-14 DIAGNOSIS — Z590 Homelessness: Secondary | ICD-10-CM | POA: Diagnosis not present

## 2018-07-14 DIAGNOSIS — Z79899 Other long term (current) drug therapy: Secondary | ICD-10-CM

## 2018-07-14 DIAGNOSIS — R440 Auditory hallucinations: Secondary | ICD-10-CM | POA: Diagnosis not present

## 2018-07-14 DIAGNOSIS — R45851 Suicidal ideations: Secondary | ICD-10-CM | POA: Insufficient documentation

## 2018-07-14 DIAGNOSIS — Z20828 Contact with and (suspected) exposure to other viral communicable diseases: Secondary | ICD-10-CM | POA: Insufficient documentation

## 2018-07-14 DIAGNOSIS — F1094 Alcohol use, unspecified with alcohol-induced mood disorder: Secondary | ICD-10-CM | POA: Diagnosis not present

## 2018-07-14 DIAGNOSIS — Z7289 Other problems related to lifestyle: Secondary | ICD-10-CM

## 2018-07-14 DIAGNOSIS — F10929 Alcohol use, unspecified with intoxication, unspecified: Secondary | ICD-10-CM

## 2018-07-14 HISTORY — DX: Other specified health status: Z78.9

## 2018-07-14 LAB — COMPREHENSIVE METABOLIC PANEL
ALT: 35 U/L (ref 0–44)
AST: 35 U/L (ref 15–41)
Albumin: 4.2 g/dL (ref 3.5–5.0)
Alkaline Phosphatase: 70 U/L (ref 38–126)
Anion gap: 12 (ref 5–15)
BUN: 14 mg/dL (ref 6–20)
CO2: 21 mmol/L — ABNORMAL LOW (ref 22–32)
Calcium: 8.9 mg/dL (ref 8.9–10.3)
Chloride: 108 mmol/L (ref 98–111)
Creatinine, Ser: 1.27 mg/dL — ABNORMAL HIGH (ref 0.61–1.24)
GFR calc Af Amer: >60 mL/min (ref >60)
GFR calc non Af Amer: >60 mL/min (ref >60)
Glucose, Bld: 103 mg/dL — ABNORMAL HIGH (ref 70–99)
Potassium: 3.3 mmol/L — ABNORMAL LOW (ref 3.5–5.1)
Sodium: 141 mmol/L (ref 135–145)
Total Bilirubin: 0.6 mg/dL (ref 0.3–1.2)
Total Protein: 7 g/dL (ref 6.5–8.1)

## 2018-07-14 LAB — RAPID URINE DRUG SCREEN, HOSP PERFORMED
Amphetamines: NOT DETECTED
Barbiturates: NOT DETECTED
Benzodiazepines: POSITIVE — AB
Cocaine: NOT DETECTED
Opiates: NOT DETECTED
Tetrahydrocannabinol: POSITIVE — AB

## 2018-07-14 LAB — CBC
HCT: 43.8 % (ref 39.0–52.0)
Hemoglobin: 14.6 g/dL (ref 13.0–17.0)
MCH: 31 pg (ref 26.0–34.0)
MCHC: 33.3 g/dL (ref 30.0–36.0)
MCV: 93 fL (ref 80.0–100.0)
Platelets: 152 10*3/uL (ref 150–400)
RBC: 4.71 MIL/uL (ref 4.22–5.81)
RDW: 13.7 % (ref 11.5–15.5)
WBC: 9.4 10*3/uL (ref 4.0–10.5)
nRBC: 0 % (ref 0.0–0.2)

## 2018-07-14 LAB — SARS CORONAVIRUS 2 BY RT PCR (HOSPITAL ORDER, PERFORMED IN ~~LOC~~ HOSPITAL LAB): SARS Coronavirus 2: NEGATIVE

## 2018-07-14 LAB — ACETAMINOPHEN LEVEL: Acetaminophen (Tylenol), Serum: <10 ug/mL — ABNORMAL LOW (ref 10–30)

## 2018-07-14 LAB — ETHANOL: Alcohol, Ethyl (B): 190 mg/dL — ABNORMAL HIGH (ref <10)

## 2018-07-14 LAB — SALICYLATE LEVEL: Salicylate Lvl: <7 mg/dL (ref 2.8–30.0)

## 2018-07-14 MED ORDER — ALUM & MAG HYDROXIDE-SIMETH 200-200-20 MG/5ML PO SUSP: 30.0000 mL | ORAL | Status: DC | PRN

## 2018-07-14 MED ORDER — ONDANSETRON 4 MG PO TBDP: 4.0000 mg | ORAL_TABLET | Freq: Four times a day (QID) | ORAL | Status: AC | PRN

## 2018-07-14 MED ORDER — ZIPRASIDONE MESYLATE 20 MG IM SOLR
20.0000 mg | Freq: Once | INTRAMUSCULAR | Status: AC
Start: 2018-07-14 — End: 2018-07-14
  Administered 2018-07-14: 03:00:00 20 mg via INTRAMUSCULAR

## 2018-07-14 MED ORDER — LORAZEPAM 1 MG PO TABS
1.0000 mg | ORAL_TABLET | Freq: Four times a day (QID) | ORAL | Status: DC | PRN
  Administered 2018-07-14: 1 mg via ORAL
  Filled 2018-07-14: qty 1

## 2018-07-14 MED ORDER — STERILE WATER FOR INJECTION IJ SOLN
INTRAMUSCULAR | Status: AC
  Filled 2018-07-14: qty 10

## 2018-07-14 MED ORDER — LORAZEPAM 2 MG/ML IJ SOLN
2.0000 mg | Freq: Once | INTRAMUSCULAR | Status: AC
Start: 2018-07-14 — End: 2018-07-14
  Administered 2018-07-14: 03:00:00 2 mg via INTRAMUSCULAR

## 2018-07-14 MED ORDER — MAGNESIUM HYDROXIDE 400 MG/5ML PO SUSP: 30.0000 mL | Freq: Every day | ORAL | Status: DC | PRN

## 2018-07-14 MED ORDER — HYDROXYZINE HCL 25 MG PO TABS
25.0000 mg | ORAL_TABLET | Freq: Three times a day (TID) | ORAL | Status: DC | PRN
  Administered 2018-07-15 – 2018-07-20 (×11): 25 mg via ORAL
  Filled 2018-07-14 (×11): qty 1

## 2018-07-14 MED ORDER — ACETAMINOPHEN 325 MG PO TABS: 650.0000 mg | ORAL_TABLET | Freq: Four times a day (QID) | ORAL | Status: DC | PRN

## 2018-07-14 MED ORDER — HEPARIN SOD (PORK) LOCK FLUSH 100 UNIT/ML IV SOLN: 500.0000 [IU] | Freq: Once | INTRAVENOUS | Status: DC

## 2018-07-14 MED ORDER — LORAZEPAM 2 MG/ML IJ SOLN
INTRAMUSCULAR | Status: AC
  Filled 2018-07-14: qty 1

## 2018-07-14 MED ORDER — LOPERAMIDE HCL 2 MG PO CAPS: 2.0000 mg | ORAL_CAPSULE | ORAL | Status: AC | PRN

## 2018-07-14 MED ORDER — TRAZODONE HCL 50 MG PO TABS
50.0000 mg | ORAL_TABLET | Freq: Every evening | ORAL | Status: DC | PRN
  Administered 2018-07-15 – 2018-07-18 (×5): 50 mg via ORAL
  Filled 2018-07-14 (×5): qty 1

## 2018-07-14 MED ORDER — ZIPRASIDONE MESYLATE 20 MG IM SOLR
INTRAMUSCULAR | Status: AC
  Filled 2018-07-14: qty 20

## 2018-07-14 MED ORDER — NICOTINE 21 MG/24HR TD PT24
21.0000 mg | MEDICATED_PATCH | Freq: Every day | TRANSDERMAL | Status: DC
  Administered 2018-07-15 – 2018-07-19 (×5): 21 mg via TRANSDERMAL
  Filled 2018-07-14 (×7): qty 1
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
## 2018-07-14 NOTE — ED Notes (Signed)
tts at bedside 

## 2018-07-14 NOTE — Progress Notes (Signed)
Pt accepted to [facility]; room 301-1 Ricky Ala, NP is the accepting provider.   Dr. Parke Poisson is the attending provider.   Call report to [phone] RN @ [facility] ED notified.    Pt is involuntary and will be transported by law enforcement.  Pt is scheduled to arrive at [facility] [place], [place], [place] Disposition [place] [facility]/TTS [phone]

## 2018-07-14 NOTE — ED Notes (Signed)
Advised patient he was accepted to [facility] and will go tonight. Patient states "ok, thank you" rolled over and closed eyes.

## 2018-07-14 NOTE — Progress Notes (Signed)
[place] NOVEL CORONAVIRUS (COVID-19) DAILY CHECK-OFF SYMPTOMS - answer yes or no to each - every day NO YES  Have you had a fever in the past 24 hours?  . Fever (Temp > 37.80C / 100F) X   Have you had any of these symptoms in the past 24 hours? . New Cough .  Sore Throat  .  Shortness of Breath .  Difficulty Breathing .  Unexplained Body Aches   X   Have you had any one of these symptoms in the past 24 hours not related to allergies?   . Runny Nose .  Nasal Congestion .  Sneezing   X   If you have had runny nose, nasal congestion, sneezing in the past 24 hours, has it worsened?  X   EXPOSURES - check yes or no X   Have you traveled outside the state in the past 14 days?  X   Have you been in contact with someone with a confirmed diagnosis of COVID-19 or PUI in the past 14 days without wearing appropriate PPE?  X   Have you been living in the same home as a person with confirmed diagnosis of COVID-19 or a PUI (household contact)?    X   Have you been diagnosed with COVID-19?    X              What to do next: Answered NO to all: Answered YES to anything:   Proceed with unit schedule Follow the [facility] Inpatient Flowsheet.   

## 2018-07-14 NOTE — ED Notes (Signed)
Patient changed into paper scrubs. Patient belongings of sneakers, shirt, shorts, and cellphone placed in EMS locker room.
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
## 2018-07-14 NOTE — ED Notes (Signed)
[name] at [facility] for update on TSS. Romie Minus states she will call this nurse back when she is able to get more information.

## 2018-07-14 NOTE — ED Notes (Signed)
Patient remains sleeping at this time. Equal rise and fall of chest noted. Officer at bedside.
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
## 2018-07-14 NOTE — ED Notes (Signed)
[name] at [facility] and advised patient was awake. States patient will be put back on list to receive telepsych.

## 2018-07-14 NOTE — ED Notes (Signed)
Pt leaving with [employer] to go to [facility].

## 2018-07-14 NOTE — ED Notes (Signed)
Patient given fluids to drink.

## 2018-07-14 NOTE — BH Assessment (Signed)
[place] Assessment Progress Note   TTS attempted to see patient, but RN states that he is currently restrained and sedated.  Stated that she would notify TTS when he is able to be assessed.
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
## 2018-07-14 NOTE — Tx Team (Signed)
Initial Treatment Plan 07/14/2018 11:47 PM Thomas Combs GGE:[id number]    PATIENT STRESSORS: Marital or family conflict Substance abuse   PATIENT STRENGTHS: Ability for insight Curator fund of knowledge Physical Health Special hobby/interest Supportive family/friends   PATIENT IDENTIFIED PROBLEMS: "Substance-use"  "At risk for suicide"    "Depression"                  DISCHARGE CRITERIA:  Ability to meet basic life and health needs Improved stabilization in mood, thinking, and/or behavior Verbal commitment to aftercare and medication compliance Withdrawal symptoms are absent or subacute and managed without 24-hour nursing intervention  PRELIMINARY DISCHARGE PLAN: Attend PHP/IOP Attend 12-step recovery group Outpatient therapy Placement in alternative living arrangements Return to previous living arrangement  PATIENT/FAMILY INVOLVEMENT: This treatment plan has been presented to and reviewed with the patient, Thomas Combs. The patient have been given the opportunity to ask questions and make suggestions.  Lonia Skinner, RN 07/14/2018, 11:47 PM

## 2018-07-15 DIAGNOSIS — F10220 Alcohol dependence with intoxication, uncomplicated: Secondary | ICD-10-CM

## 2018-07-15 DIAGNOSIS — F332 Major depressive disorder, recurrent severe without psychotic features: Principal | ICD-10-CM

## 2018-07-15 LAB — LIPID PANEL
Cholesterol: 128 mg/dL (ref 0–200)
HDL: 53 mg/dL (ref >40)
LDL Cholesterol: 50 mg/dL (ref 0–99)
Total CHOL/HDL Ratio: 2.4 RATIO
Triglycerides: 125 mg/dL (ref <150)
VLDL: 25 mg/dL (ref 0–40)

## 2018-07-15 LAB — TSH: TSH: 2.666 u[IU]/mL (ref 0.350–4.500)

## 2018-07-15 MED ORDER — CHLORDIAZEPOXIDE HCL 25 MG PO CAPS
25.0000 mg | ORAL_CAPSULE | Freq: Every day | ORAL | Status: AC
  Administered 2018-07-18: 25 mg via ORAL
  Filled 2018-07-15: qty 1

## 2018-07-15 MED ORDER — CHLORDIAZEPOXIDE HCL 25 MG PO CAPS
25.0000 mg | ORAL_CAPSULE | ORAL | Status: AC
  Administered 2018-07-17 (×2): 25 mg via ORAL
  Filled 2018-07-15 (×2): qty 1

## 2018-07-15 MED ORDER — CHLORDIAZEPOXIDE HCL 25 MG PO CAPS
25.0000 mg | ORAL_CAPSULE | Freq: Three times a day (TID) | ORAL | Status: AC
  Administered 2018-07-16 (×3): 25 mg via ORAL
  Filled 2018-07-15 (×3): qty 1

## 2018-07-15 MED ORDER — CHLORDIAZEPOXIDE HCL 25 MG PO CAPS
25.0000 mg | ORAL_CAPSULE | Freq: Four times a day (QID) | ORAL | Status: AC
  Administered 2018-07-15 (×3): 25 mg via ORAL
  Filled 2018-07-15 (×3): qty 1

## 2018-07-15 MED ORDER — FLUOXETINE HCL 20 MG PO CAPS
20.0000 mg | ORAL_CAPSULE | Freq: Every day | ORAL | Status: DC
  Administered 2018-07-15 – 2018-07-19 (×5): 20 mg via ORAL
  Filled 2018-07-15: qty 7
  Filled 2018-07-15 (×4): qty 1
  Filled 2018-07-15: qty 7
  Filled 2018-07-15 (×2): qty 1
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
## 2018-07-15 NOTE — [facility] Suicide Risk Assessment (Signed)
[place] INPATIENT:  Family/Significant Other Suicide Prevention Education  Suicide Prevention Education:  Patient Refusal for Family/Significant Other Suicide Prevention Education: The patient Thomas Combs has refused to provide written consent for family/significant other to be provided Family/Significant Other Suicide Prevention Education during admission and/or prior to discharge.  Physician notified.   Patient does not have his grandmother's phone number, SPE reviewed with patient. Pamphlet will be provided to patient to share with supports.  Joellen Jersey 07/15/2018, 10:36 AM

## 2018-07-15 NOTE — BHH Suicide Risk Assessment (Signed)
[facility] Admission Suicide Risk Assessment   Nursing information obtained from:   Patient and chart Demographic factors:  Male Current Mental Status:  Suicidal ideation indicated by patient Loss Factors:  NA Historical Factors:  Impulsivity Risk Reduction Factors:  Living with another person, especially a relative  Total Time spent with patient: 45 minutes Principal Problem: Self-harm in the context of intoxication Diagnosis:  Active Problems:   Severe recurrent major depression without psychotic features (HCC)   Alcohol dependence with uncomplicated intoxication ([place])  Subjective Data: Patient was initially combative when intoxicated is calmer now agrees to full assessment as below  Continued Clinical Symptoms:  Alcohol Use Disorder Identification Test Final Score (AUDIT): 6 The "Alcohol Use Disorders Identification Test", Guidelines for Use in Primary Care, Second Edition.  World Pharmacologist [facility]). Score between 0-7:  no or low risk or alcohol related problems. Score between 8-15:  moderate risk of alcohol related problems. Score between 16-19:  high risk of alcohol related problems. Score 20 or above:  warrants further diagnostic evaluation for alcohol dependence and treatment.   CLINICAL FACTORS:   Dysthymia   Musculoskeletal: Strength & Muscle Tone: within normal limits Gait & Station: normal Patient leans: N/A  Psychiatric Specialty Exam: Physical Exam no acute withdrawal but blood pressure  Review of Systems  Constitutional: Negative.   HENT: Negative.   Eyes: Negative.   Cardiovascular: Negative.   Skin: Negative.   Neurological: Negative.    neurological negative cardiovascular negative endocrine negative abdominal negative  Blood pressure (!) 137/97, pulse 73, temperature 97.6 F (36.4 C), temperature source Oral, resp. rate 18, height 6' (1.829 m), weight 82 kg, SpO2 100 %.Body mass index is 24.52 kg/m.  General Appearance: Disheveled  Eye Contact:   Minimal  Speech:  Slow  Volume:  Decreased  Mood:  Dysphoric  Affect:  Congruent  Thought Process:  Linear  Orientation:  Full (Time, Place, and Person)  Thought Content:  Logical and Tangential  Suicidal Thoughts:  No  Homicidal Thoughts:  No  Memory:  Immediate;   Fair  Judgement:  Fair  Insight:  Fair  Psychomotor Activity:  Normal  Concentration:  Concentration: Good  Recall:  Good  Fund of Knowledge:  Good  Language:  Good  Akathisia:  Negative  Handed:  Right  AIMS (if indicated):     Assets:  Communication Skills Physical Health  ADL's:  Intact  Cognition:  WNL  Sleep:  Number of Hours: 5.25      COGNITIVE FEATURES THAT CONTRIBUTE TO RISK:  None    SUICIDE RISK:   Mild:  Suicidal ideation of limited frequency, intensity, duration, and specificity.  There are no identifiable plans, no associated intent, mild dysphoria and related symptoms, good self-control (both objective and subjective assessment), few other risk factors, and identifiable protective factors, including available and accessible social support.  PLAN OF CARE: see eval  I certify that inpatient services furnished can reasonably be expected to improve the patient's condition.   Johnn Hai, MD 07/15/2018, 10:59 AM

## 2018-07-15 NOTE — Progress Notes (Signed)
_Patient attended group and participated,.

## 2018-07-15 NOTE — BHH Counselor (Signed)
Adult Comprehensive Assessment  Patient ID: Thomas Combs, male   DOB: 1990/06/29, 28 y.o.   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 Mother lost custody of him as a child due to drug issues. Patient has a 28 year old daughter that lives with her mom. Financial / Lack of resources (include bankruptcy): Inconsistent and limited income, no insurance Housing / Lack of housing: Lives with grandmother, they argue often, he isn't sure if he can return to her place Physical health (include injuries & life threatening diseases): Good, denies stressors Social relationships: Girlfriend dumped him yesterday, has friends who are using Substance abuse: Daily beer or liquor, usually 10 beers or 10 shots daily Bereavement / Loss: Has lost several friends to opioid use  Living/Environment/Situation:  Living Arrangements: Other relatives Living conditions (as described by patient or guardian): Single family home in [place] Who else lives in the home?: Grandmother How long has patient lived in current situation?: 2 years What is atmosphere in current home: Chaotic  Family History:  Marital status: Single Are you sexually active?: Yes What is your sexual orientation?: Straight Has your sexual activity been affected by drugs, alcohol, medication, or emotional stress?: Denies Does patient have children?: Yes How many children?: 2 How is patient's  relationship with their children?: 28 year old daughter, she lives with her mom  Childhood History:  By whom was/is the patient raised?: Father Additional childhood history information: Mom had drug problems and lost custody, patient was raised by dad from the time he was a toddler Description of patient's relationship with caregiver when they were a child: Not much contact with mom, good with dad Patient's description of current relationship with people who raised him/her: Not much contact with mom, good with dad- dad lives in [place] How were you disciplined when you got in trouble as a child/adolescent?: Appropriate Does patient have siblings?: Yes Number of Siblings: 6 Description of patient's current relationship with siblings: 1 full brother that lives in [place]. 2 half brothers and 3 half sisters in [place]- Good relationships Did patient suffer any verbal/emotional/physical/sexual abuse as a child?: No Did patient suffer from severe childhood neglect?: No Has patient ever been sexually abused/assaulted/raped as an adolescent or adult?: No Was the patient ever a victim of a crime or a disaster?: No Witnessed domestic violence?: No Has patient been effected by domestic violence as an adult?: No  Education:  Highest grade of school patient has completed: Dropped out in 11th grade Currently a student?: No Learning disability?: No  Employment/Work Situation:   Employment situation: Unemployed Patient's job has been impacted by current illness: No What is the longest time patient has a held a job?: 2 years 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 Did You Receive Any Psychiatric Treatment/Services While in [employer]?: No Are There Guns or Other Weapons in Your Home?: No  Financial Resources:   Financial resources: No income Does patient have a Lawyerrepresentative payee or guardian?: No  Alcohol/Substance Abuse:   What has been your use of drugs/alcohol within the last  12 months?: Daily ETOH, 10-12 beers or shots of liquor a day If attempted suicide, did drugs/alcohol play a role in this?: No Alcohol/Substance Abuse Treatment Hx: Denies past history Has alcohol/substance abuse ever caused legal problems?: No  Social Support System:   Pensions consultant Support System: Fair Astronomer System: Family, friends Type of faith/religion: None How does patient's faith help to cope with current illness?: n/a  Leisure/Recreation:   Leisure and Hobbies: Proofreader, fishing  Strengths/Needs:   What is the patient's perception of their strengths?: Skateboarding, fishing Patient states they can use these personal strengths during their treatment to contribute to their recovery: unsure Patient states these barriers may affect/interfere with their treatment: denies Patient states these barriers may affect their return to the community: Isn't sure if he can go home  Discharge Plan:   Currently receiving community mental health services: No Patient states concerns and preferences for aftercare planning are: Declines residential treatment referrals. Agreeable to [facility] for outpatient./ Patient states they will know when they are safe and ready for discharge when: Not sure Does patient have access to transportation?: Yes Does patient have financial barriers related to discharge medications?: Yes Patient description of barriers related to discharge medications: No income, no insurance Will patient be returning to same living situation after discharge?: Yes(He needs to call his grandmother to confirm)  Summary/Recommendations:   Summary and Recommendations (to be completed by the evaluator): Thomas Combs is a 28 year old male from [place] who presents to [facility] under IVC. Patient reports he had an argument with his grandmother and she told him to kill himself, patient then used a butterknife to cut his arm. Patient endorses ETOH  abuse, other stresses include financial stressors and conflict with his grandmother. Patient denies prior hospitalizations or outpatient treatment. He is agreeable to being referred to [facility] for outpatient. Patient will benefit from crisis stabilization, medication management, therapeutic miliue, and referral for services.  Joellen [place]. 07/15/2018

## 2018-07-15 NOTE — BHH Group Notes (Signed)
[facility] Mental Health Association Group Therapy 07/15/2018 1:53 PM  Type of Therapy: Mental Health Association Presentation  Participation Level: Active  Participation Quality: Attentive  Affect: Appropriate  Cognitive: Oriented  Insight: Developing/Improving  Engagement in Therapy: Engaged  Modes of Intervention: Discussion, Education and Socialization   Summary of Progress/Problems: [place] ([place]) Speaker came to talk about his personal journey with mental health. The pt processed ways by which to relate to the speaker. [place] speaker provided handouts and educational information pertaining to groups and services offered by the [facility]. Pt was engaged in speaker's presentation and was receptive to resources provided.   Stephanie Acre, MSW, [facility] 07/15/2018 1:53 PM

## 2018-07-15 NOTE — Progress Notes (Signed)
Patient ID: Thomas Combs, male   DOB: 01-01-1991, 28 y.o.   MRN: [id number] D: Patient pacing up and down the hallway reporting feeling anxious. Pt mood and affect appears depressed and anxious. Pt denies any other withdrawal symptoms.  Denies  SI/HI/AVH and pain.No behavioral issues noted.  A: Support and encouragement offered as needed to express needs. Medications administered as prescribed.  R: Patient is safe and cooperative on unit. Will continue to monitor  for safety and stability.

## 2018-07-15 NOTE — Plan of Care (Signed)
Progress note  D: pt found in bed; compliant with medication administration. Pt denies any physical symptoms or complaints. Pt states he slept well and wants to continue on his current regimen. Pt is guarded in his assessment but pleasant. Pt denies si/hi/ah/vh and verbally agrees to approach staff if these become apparent or before harming himself/others while at [place].  A: pt provided support and encouragement. Pt given medication per protocol and standing orders. Q89m safety checks implemented and continued.  R: pt safe on the unit. Will continue to monitor.   Pt progressing in the following metrics  Problem: Education: Goal: Utilization of techniques to improve thought processes will improve Outcome: Progressing Goal: Knowledge of the prescribed therapeutic regimen will improve Outcome: Progressing   Problem: Activity: Goal: Interest or engagement in leisure activities will improve Outcome: Progressing Goal: Imbalance in normal sleep/wake cycle will improve Outcome: Progressing

## 2018-07-16 NOTE — Progress Notes (Signed)
[facility]  07/16/2018 11:39 AM Adelene IdlerBruce A Dittmer  MRN:  [id number] Subjective:    Patient in bed reports minimal withdrawal symptoms believes medications helpful stating he would like to stay 1 more day to be sure of improved status/compliance tends to dictate treatment but is not problematic denies wanting to harm self or others no psychosis no tremors noted  Principal Problem: Needing detox and treatment of affective illness Diagnosis: Active Problems:   Severe recurrent major depression without psychotic features (HCC)   Alcohol dependence with uncomplicated intoxication (HCC)  Total Time spent with patient: 20 minutes  Past Medical History:  Past Medical History:  Diagnosis Date  . Medical history non-contributory    History reviewed. No pertinent surgical history. Family History:  Family History  Problem Relation Age of Onset  . Diabetes Mother    Social History:  Social History   Substance and Sexual Activity  Alcohol Use Yes   Comment: pt will not answer     Social History   Substance and Sexual Activity  Drug Use No   Comment: pt will not answer    Social History   Socioeconomic History  . Marital status: Single    Spouse name: Not on file  . Number of children: Not on file  . Years of education: Not on file  . Highest education level: Not on file  Occupational History  . Not on file  Social Needs  . Financial resource strain: Not on file  . Food insecurity    Worry: Not on file    Inability: Not on file  . Transportation needs    Medical: Not on file    Non-medical: Not on file  Tobacco Use  . Smoking status: Current Every Day Smoker    Packs/day: 0.50    Types: Cigarettes  . Smokeless tobacco: Never Used  Substance and Sexual Activity  . Alcohol use: Yes    Comment: pt will not answer  . Drug use: No    Comment: pt will not answer  . Sexual activity: Not on file  Lifestyle  . Physical activity    Days per week: Not on file   Minutes per session: Not on file  . Stress: Not on file  Relationships  . Social Musicianconnections    Talks on phone: Not on file    Gets together: Not on file    Attends religious service: Not on file    Active member of club or organization: Not on file    Attends meetings of clubs or organizations: Not on file    Relationship status: Not on file  Other Topics Concern  . Not on file  Social History Narrative  . Not on file   Additional Social History:                         Sleep: Good  Appetite:  Good  Current Medications: Current Facility-Administered Medications  Medication Dose Route Frequency Provider Last Rate Last Dose  . acetaminophen (TYLENOL) tablet 650 mg  650 mg Oral Q6H PRN Nira ConnBerry, Jason A, NP      . alum & mag hydroxide-simeth (MAALOX/MYLANTA) 200-200-20 MG/5ML suspension 30 mL  30 mL Oral Q4H PRN Nira ConnBerry, Jason A, NP      . chlordiazePOXIDE (LIBRIUM) capsule 25 mg  25 mg Oral TID Malvin JohnsFarah, Volanda Mangine, MD   25 mg at 07/16/18 0800   Followed by  . [START ON 07/17/2018] chlordiazePOXIDE (LIBRIUM) capsule  25 mg  25 mg Oral Celine Ahr, MD       Followed by  . [START ON 07/18/2018] chlordiazePOXIDE (LIBRIUM) capsule 25 mg  25 mg Oral Daily Johnn Hai, MD      . FLUoxetine (PROZAC) capsule 20 mg  20 mg Oral Daily Johnn Hai, MD   20 mg at 07/16/18 0800  . hydrOXYzine (ATARAX/VISTARIL) tablet 25 mg  25 mg Oral TID PRN Lindon Romp A, NP   25 mg at 07/15/18 1948  . loperamide (IMODIUM) capsule 2-4 mg  2-4 mg Oral PRN Lindon Romp A, NP      . magnesium hydroxide (MILK OF MAGNESIA) suspension 30 mL  30 mL Oral Daily PRN Lindon Romp A, NP      . nicotine (NICODERM CQ - dosed in mg/24 hours) patch 21 mg  21 mg Transdermal Daily Cobos, Myer Peer, MD   21 mg at 07/16/18 0800  . ondansetron (ZOFRAN-ODT) disintegrating tablet 4 mg  4 mg Oral Q6H PRN Rozetta Nunnery, NP      . traZODone (DESYREL) tablet 50 mg  50 mg Oral QHS PRN Rozetta Nunnery, NP   50 mg at 07/15/18  2112    Lab Results:  Results for orders placed or performed during the hospital encounter of 07/14/18 (from the past 48 hour(s))  Lipid panel     Status: None   Collection Time: 07/15/18  6:44 AM  Result Value Ref Range   Cholesterol 128 0 - 200 mg/dL   Triglycerides 125 <150 mg/dL   HDL 53 >40 mg/dL   Total CHOL/HDL Ratio 2.4 RATIO   VLDL 25 0 - 40 mg/dL   LDL Cholesterol 50 0 - 99 mg/dL    Comment:        Total Cholesterol/HDL:CHD Risk Coronary Heart Disease Risk Table                     Men   Women  1/2 Average Risk   3.4   3.3  Average Risk       5.0   4.4  2 X Average Risk   9.6   7.1  3 X Average Risk  23.4   11.0        Use the calculated Patient Ratio above and the CHD Risk Table to determine the patient's CHD Risk.        ATP III CLASSIFICATION (LDL):  <100     mg/dL   Optimal  100-129  mg/dL   Near or Above                    Optimal  130-159  mg/dL   Borderline  160-189  mg/dL   High  >190     mg/dL   Very High Performed at [facility] [address], [place], [place] [address]   TSH     Status: None   Collection Time: 07/15/18  6:44 AM  Result Value Ref Range   TSH 2.666 0.350 - 4.500 uIU/mL    Comment: Performed by a 3rd Generation assay with a functional sensitivity of <=0.01 uIU/mL. Performed at [facility], [place] [address], [place], [place] [address]     Blood Alcohol level:  Lab Results  Component Value Date   ETH 190 (H) 93/79/0240    Metabolic Disorder Labs: No results found for: HGBA1C, MPG No results found for: PROLACTIN Lab Results  Component Value Date   CHOL  128 07/15/2018   TRIG 125 07/15/2018   HDL 53 07/15/2018   CHOLHDL 2.4 07/15/2018   VLDL 25 07/15/2018   LDLCALC 50 07/15/2018    Physical Findings: AIMS:  , ,  ,  ,    CIWA:  CIWA-Ar Total: 4 COWS:     Musculoskeletal: Strength & Muscle Tone: within normal limits Gait & Station: normal Patient leans: N/A  Psychiatric  Specialty Exam: Physical Exam  ROS  Blood pressure 124/71, pulse 86, temperature 97.8 F (36.6 C), temperature source Oral, resp. rate 18, height 6' (1.829 m), weight 82 kg, SpO2 100 %.Body mass index is 24.52 kg/m.  General Appearance: Casual  Eye Contact:  Minimal  Speech:  Slow  Volume:  Decreased  Mood:  Dysphoric  Affect:  Appropriate  Thought Process:  Coherent and Descriptions of Associations: Intact  Orientation:  Full (Time, Place, and Person)  Thought Content:  Logical  Suicidal Thoughts:  No  Homicidal Thoughts:  No  Memory:  Immediate;   Good  Judgement:  Good  Insight:  Good  Psychomotor Activity:  Normal  Concentration:  Concentration: Good  Recall:  Good  Fund of Knowledge:  Good  Language:  Good  Akathisia:  Negative  Handed:  Right  AIMS (if indicated):     Assets:  Physical Health  ADL's:  Intact  Cognition:  WNL  Sleep:  Number of Hours: 6.75     Treatment Plan Summary: Daily contact with patient to assess and evaluate symptoms and progress in treatment and Medication management continue current cognitive and rehab based therapies continue detox measures and antidepressant probable discharge tomorrow  Malvin JohnsFARAH,Codylee Patil, MD 07/16/2018, 11:39 AM

## 2018-07-16 NOTE — Progress Notes (Signed)
Adult Psychoeducational Group Note  Date:  07/16/2018 Time:  10:16 PM  Group Topic/Focus:  Wrap-Up Group:   The focus of this group is to help patients review their daily goal of treatment and discuss progress on daily workbooks.  Participation Level:  Active  Participation Quality:  Appropriate  Affect:  Appropriate  Cognitive:  Appropriate  Insight: Appropriate  Engagement in Group:  Engaged  Modes of Intervention:  Discussion  Additional Comments:  Patient attended group and participated.  Zoii Florer W Lerin Jech 3/95/3202, 10:16 PM

## 2018-07-16 NOTE — Progress Notes (Signed)
D:  Patient reports continued anxiety due to not wanting to discharge too early.  Patient is anxious throughout the evening even after being medicated with vistaril.  He paces the hallway frequently, but does laugh and interact intermittently with peers.  He denies any thoughts of hurting himself or others and contracts for safety on the unit.  Compliant with medications.  A:  Safety check q 15 minutes. Emotional support provided.  Medications administered as ordered.  R:  Safety maintained on unit.   [place] NOVEL CORONAVIRUS (COVID-19) DAILY CHECK-OFF SYMPTOMS - answer yes or no to each - every day NO YES  Have you had a fever in the past 24 hours?  . Fever (Temp > 37.80C / 100F) X   Have you had any of these symptoms in the past 24 hours? . New Cough .  Sore Throat  .  Shortness of Breath .  Difficulty Breathing .  Unexplained Body Aches   X   Have you had any one of these symptoms in the past 24 hours not related to allergies?   . Runny Nose .  Nasal Congestion .  Sneezing   X   If you have had runny nose, nasal congestion, sneezing in the past 24 hours, has it worsened?  X   EXPOSURES - check yes or no X   Have you traveled outside the state in the past 14 days?  X   Have you been in contact with someone with a confirmed diagnosis of COVID-19 or PUI in the past 14 days without wearing appropriate PPE?  X   Have you been living in the same home as a person with confirmed diagnosis of COVID-19 or a PUI (household contact)?    X   Have you been diagnosed with COVID-19?    X              What to do next: Answered NO to all: Answered YES to anything:   Proceed with unit schedule Follow the [facility] Inpatient Flowsheet.

## 2018-07-16 NOTE — Plan of Care (Addendum)
Patient was anxious upon approach. When writer asked patient if he was suicidal, patient mumbled something, then would not repeat himself. Writer made sure to contract for safety with patient who verbally agreed. Denies HI AVH. Denies physical pain. Patient is compliant with mediations- no side effects noted. Safety is maintained with 15 minute checks as well as environmental checks. Will continue to monitor and assess throughout the shift.  Problem: Education: Goal: Utilization of techniques to improve thought processes will improve Outcome: Progressing Goal: Knowledge of the prescribed therapeutic regimen will improve Outcome: Progressing   Problem: Activity: Goal: Interest or engagement in leisure activities will improve Outcome: Progressing Goal: Imbalance in normal sleep/wake cycle will improve Outcome: Progressing   Problem: Coping: Goal: Coping ability will improve Outcome: Progressing

## 2018-07-16 NOTE — Tx Team (Signed)
Interdisciplinary Treatment and Diagnostic Plan Update  07/16/2018 Time of Session: 9:00am Thomas Combs MRN: [id number]  Principal Diagnosis: <principal problem not specified>  Secondary Diagnoses: Active Problems:   Severe recurrent major depression without psychotic features ([place])   Alcohol dependence with uncomplicated intoxication ([place])   Current Medications:  Current Facility-Administered Medications  Medication Dose Route Frequency Provider Last Rate Last Dose  . acetaminophen (TYLENOL) tablet 650 mg  650 mg Oral Q6H PRN Lindon Romp A, NP      . alum & mag hydroxide-simeth (MAALOX/MYLANTA) 200-200-20 MG/5ML suspension 30 mL  30 mL Oral Q4H PRN Lindon Romp A, NP      . chlordiazePOXIDE (LIBRIUM) capsule 25 mg  25 mg Oral TID Johnn Hai, MD   25 mg at 07/16/18 0800   Followed by  . [START ON 07/17/2018] chlordiazePOXIDE (LIBRIUM) capsule 25 mg  25 mg Oral Celine Ahr, MD       Followed by  . [START ON 07/18/2018] chlordiazePOXIDE (LIBRIUM) capsule 25 mg  25 mg Oral Daily Johnn Hai, MD      . FLUoxetine (PROZAC) capsule 20 mg  20 mg Oral Daily Johnn Hai, MD   20 mg at 07/16/18 0800  . hydrOXYzine (ATARAX/VISTARIL) tablet 25 mg  25 mg Oral TID PRN Lindon Romp A, NP   25 mg at 07/15/18 1948  . loperamide (IMODIUM) capsule 2-4 mg  2-4 mg Oral PRN Lindon Romp A, NP      . LORazepam (ATIVAN) tablet 1 mg  1 mg Oral Q6H PRN Lindon Romp A, NP   1 mg at 07/14/18 2330  . magnesium hydroxide (MILK OF MAGNESIA) suspension 30 mL  30 mL Oral Daily PRN Lindon Romp A, NP      . nicotine (NICODERM CQ - dosed in mg/24 hours) patch 21 mg  21 mg Transdermal Daily Cobos, Myer Peer, MD   21 mg at 07/16/18 0800  . ondansetron (ZOFRAN-ODT) disintegrating tablet 4 mg  4 mg Oral Q6H PRN Lindon Romp A, NP      . traZODone (DESYREL) tablet 50 mg  50 mg Oral QHS PRN Rozetta Nunnery, NP   50 mg at 07/15/18 2112   PTA Medications: Medications Prior to Admission  Medication Sig Dispense  Refill Last Dose  . dicyclomine (BENTYL) 20 MG tablet Take 1 tablet (20 mg total) by mouth 3 (three) times daily before meals. 15 tablet 0   . ranitidine (ZANTAC) 150 MG tablet Take 1 tablet (150 mg total) by mouth 2 (two) times daily. 60 tablet 0     Patient Stressors: Marital or family conflict Substance abuse  Patient Strengths: Ability for insight Curator fund of knowledge Physical Health Special hobby/interest Supportive family/friends  Treatment Modalities: Medication Management, Group therapy, Case management,  1 to 1 session with clinician, Psychoeducation, Recreational therapy.   Physician Treatment Plan for Primary Diagnosis: <principal problem not specified> Long Term Goal(s): Improvement in symptoms so as ready for discharge Improvement in symptoms so as ready for discharge   Short Term Goals: Ability to identify changes in lifestyle to reduce recurrence of condition will improve Ability to verbalize feelings will improve Ability to demonstrate self-control will improve Ability to identify and develop effective coping behaviors will improve Ability to identify and develop effective coping behaviors will improve Ability to maintain clinical measurements within normal limits will improve Compliance with prescribed medications will improve  Medication Management: Evaluate patient's response, side effects, and tolerance of medication regimen.  Therapeutic Interventions:  1 to 1 sessions, Unit Group sessions and Medication administration.  Evaluation of Outcomes: Progressing  Physician Treatment Plan for Secondary Diagnosis: Active Problems:   Severe recurrent major depression without psychotic features (HCC)   Alcohol dependence with uncomplicated intoxication (HCC)  Long Term Goal(s): Improvement in symptoms so as ready for discharge Improvement in symptoms so as ready for discharge   Short Term Goals: Ability to identify changes in lifestyle  to reduce recurrence of condition will improve Ability to verbalize feelings will improve Ability to demonstrate self-control will improve Ability to identify and develop effective coping behaviors will improve Ability to identify and develop effective coping behaviors will improve Ability to maintain clinical measurements within normal limits will improve Compliance with prescribed medications will improve     Medication Management: Evaluate patient's response, side effects, and tolerance of medication regimen.  Therapeutic Interventions: 1 to 1 sessions, Unit Group sessions and Medication administration.  Evaluation of Outcomes: Progressing   RN Treatment Plan for Primary Diagnosis: <principal problem not specified> Long Term Goal(s): Knowledge of disease and therapeutic regimen to maintain health will improve  Short Term Goals: Ability to remain free from injury will improve, Ability to identify and develop effective coping behaviors will improve and Compliance with prescribed medications will improve  Medication Management: RN will administer medications as ordered by provider, will assess and evaluate patient's response and provide education to patient for prescribed medication. RN will report any adverse and/or side effects to prescribing provider.  Therapeutic Interventions: 1 on 1 counseling sessions, Psychoeducation, Medication administration, Evaluate responses to treatment, Monitor vital signs and CBGs as ordered, Perform/monitor CIWA, COWS, AIMS and Fall Risk screenings as ordered, Perform wound care treatments as ordered.  Evaluation of Outcomes: Progressing   LCSW Treatment Plan for Primary Diagnosis: <principal problem not specified> Long Term Goal(s): Safe transition to appropriate next level of care at discharge, Engage patient in therapeutic group addressing interpersonal concerns.  Short Term Goals: Engage patient in aftercare planning with referrals and resources,  Increase social support, Identify triggers associated with mental health/substance abuse issues and Increase skills for wellness and recovery  Therapeutic Interventions: Assess for all discharge needs, 1 to 1 time with Social worker, Explore available resources and support systems, Assess for adequacy in community support network, Educate family and significant other(s) on suicide prevention, Complete Psychosocial Assessment, Interpersonal group therapy.  Evaluation of Outcomes: Progressing   Progress in Treatment: Attending groups: Yes. Participating in groups: Yes. Taking medication as prescribed: Yes. Toleration medication: Yes. Family/Significant other contact made: No, will contact:  Completed SPE with patient, he doesn't know his grandmother's phone number Patient understands diagnosis: Yes. Discussing patient identified problems/goals with staff: Yes. Medical problems stabilized or resolved: Yes. Denies suicidal/homicidal ideation: No. Issues/concerns per patient self-inventory: Yes.  New problem(s) identified: Yes, Describe:  limited supports  New Short Term/Long Term Goal(s): detox, medication management for mood stabilization; elimination of SI thoughts; development of comprehensive mental wellness/sobriety plan.  Patient Goals:  "Get off the alcohol"  Discharge Plan or Barriers: Expected to discharge home with grandmother and [facility]  Reason for Continuation of Hospitalization: Anxiety Depression Withdrawal symptoms  Estimated Length of Stay: 1 day  Attendees: Patient: 07/16/2018 8:49 AM  Physician:  07/16/2018 8:49 AM  Nursing:  07/16/2018 8:49 AM  RN Care Manager: 07/16/2018 8:49 AM  Social Worker: Thomas Combs, Thomas Combs 07/16/2018 8:49 AM  Recreational Therapist:  07/16/2018 8:49 AM  Other:  07/16/2018 8:49 AM  Other:  07/16/2018 8:49  AM  Other: 07/16/2018 8:49 AM    Scribe for Treatment Team: Thomas Combs, Thomas Combs 07/16/2018 8:49 AM

## 2018-07-17 MED ORDER — OLANZAPINE 10 MG PO TABS
20.0000 mg | ORAL_TABLET | Freq: Once | ORAL | Status: AC
  Administered 2018-07-17: 20 mg via ORAL
  Filled 2018-07-17 (×2): qty 2

## 2018-07-17 MED ORDER — PRIMIDONE 50 MG PO TABS
100.0000 mg | ORAL_TABLET | Freq: Two times a day (BID) | ORAL | Status: AC
  Administered 2018-07-17 – 2018-07-18 (×3): 100 mg via ORAL
  Filled 2018-07-17 (×3): qty 2

## 2018-07-17 NOTE — Progress Notes (Signed)
D: Pt was in hallway upon initial approach.  Pt presents with depressed, anxious affect and mood.  He describes his day as "horrible" and states "I got mad earlier.  I still feel the same."  Pt attributes his "mood swings" to withdrawal.  He reports he is worried he will be discharged tomorrow because he does not feel ready to discharge.  Complains of withdrawal symptoms of tremor, sweats, "irritability."  Pt denies HI, denies hallucinations, denies pain.  He reports passive SI without a plan.  Pt verbally contracts for safety.  Pt has been visible in milieu interacting with peers and staff appropriately.     A: Introduced self to pt.  Met with pt 1:1.  Actively listened to pt and offered support and encouragement.  Medications administered per order.  PRN medication administered for sleep and anxiety.  15 minute safety checks in place.  R: Pt is safe on the unit.  Pt is compliant with medications.  Pt verbally contracts for safety.  Will continue to monitor and assess.

## 2018-07-17 NOTE — Plan of Care (Signed)
  Problem: Education: Goal: Utilization of techniques to improve thought processes will improve Outcome: Progressing Goal: Knowledge of the prescribed therapeutic regimen will improve Outcome: Progressing   Problem: Activity: Goal: Interest or engagement in leisure activities will improve Outcome: Progressing Goal: Imbalance in normal sleep/wake cycle will improve Outcome: Progressing   Problem: Coping: Goal: Coping ability will improve Outcome: Progressing   

## 2018-07-17 NOTE — Progress Notes (Signed)
[facility] MD Progress Note  07/17/2018 7:52 AM Adelene IdlerBruce A Worlds  MRN:  [id number] Subjective:   Patient requesting 1 more day or 2 more days in the hospital so he does not relapse he is alert and oriented and generally cooperative he denies current thoughts of harming self and contracts here only as he fears relapse upon discharge complaining of some chills and some cravings. Principal Problem: <principal problem not specified> Diagnosis: Active Problems:   Severe recurrent major depression without psychotic features (HCC)   Alcohol dependence with uncomplicated intoxication (H[facility])  Total Time spent with patient: 20 minutes Past Medical History:  Past Medical History:  Diagnosis Date  . Medical history non-contributory    History reviewed. No pertinent surgical history. Family History:  Family History  Problem Relation Age of Onset  . Diabetes Mother    Social History:  Social History   Substance and Sexual Activity  Alcohol Use Yes   Comment: pt will not answer     Social History   Substance and Sexual Activity  Drug Use No   Comment: pt will not answer    Social History   Socioeconomic History  . Marital status: Single    Spouse name: Not on file  . Number of children: Not on file  . Years of education: Not on file  . Highest education level: Not on file  Occupational History  . Not on file  Social Needs  . Financial resource strain: Not on file  . Food insecurity    Worry: Not on file    Inability: Not on file  . Transportation needs    Medical: Not on file    Non-medical: Not on file  Tobacco Use  . Smoking status: Current Every Day Smoker    Packs/day: 0.50    Types: Cigarettes  . Smokeless tobacco: Never Used  Substance and Sexual Activity  . Alcohol use: Yes    Comment: pt will not answer  . Drug use: No    Comment: pt will not answer  . Sexual activity: Not on file  Lifestyle  . Physical activity    Days per week: Not on file    Minutes per session:  Not on file  . Stress: Not on file  Relationships  . Social Musicianconnections    Talks on phone: Not on file    Gets together: Not on file    Attends religious service: Not on file    Active member of club or organization: Not on file    Attends meetings of clubs or organizations: Not on file    Relationship status: Not on file  Other Topics Concern  . Not on file  Social History Narrative  . Not on file   Additional Social History:                         Sleep: Good  Appetite:  Good  Current Medications: Current Facility-Administered Medications  Medication Dose Route Frequency Provider Last Rate Last Dose  . acetaminophen (TYLENOL) tablet 650 mg  650 mg Oral Q6H PRN Nira ConnBerry, Jason A, NP      . alum & mag hydroxide-simeth (MAALOX/MYLANTA) 200-200-20 MG/5ML suspension 30 mL  30 mL Oral Q4H PRN Nira ConnBerry, Jason A, NP      . chlordiazePOXIDE (LIBRIUM) capsule 25 mg  25 mg Oral Nicki GuadalajaraBH-qamhs Rocky Gladden, MD       Followed by  . [START ON 07/18/2018] chlordiazePOXIDE (LIBRIUM) capsule 25 mg  25 mg Oral Daily Johnn Hai, MD      . FLUoxetine (PROZAC) capsule 20 mg  20 mg Oral Daily Johnn Hai, MD   20 mg at 07/16/18 0800  . hydrOXYzine (ATARAX/VISTARIL) tablet 25 mg  25 mg Oral TID PRN Lindon Romp A, NP   25 mg at 07/16/18 2016  . loperamide (IMODIUM) capsule 2-4 mg  2-4 mg Oral PRN Lindon Romp A, NP      . magnesium hydroxide (MILK OF MAGNESIA) suspension 30 mL  30 mL Oral Daily PRN Lindon Romp A, NP      . nicotine (NICODERM CQ - dosed in mg/24 hours) patch 21 mg  21 mg Transdermal Daily Cobos, Myer Peer, MD   21 mg at 07/16/18 0800  . ondansetron (ZOFRAN-ODT) disintegrating tablet 4 mg  4 mg Oral Q6H PRN Lindon Romp A, NP      . traZODone (DESYREL) tablet 50 mg  50 mg Oral QHS PRN Lindon Romp A, NP   50 mg at 07/16/18 2234    Lab Results: No results found for this or any previous visit (from the past 48 hour(s)).  Blood Alcohol level:  Lab Results  Component Value Date    ETH 190 (H) 47/65/4650    Metabolic Disorder Labs: No results found for: HGBA1C, MPG No results found for: PROLACTIN Lab Results  Component Value Date   CHOL 128 07/15/2018   TRIG 125 07/15/2018   HDL 53 07/15/2018   CHOLHDL 2.4 07/15/2018   VLDL 25 07/15/2018   LDLCALC 50 07/15/2018    Physical Findings: AIMS:  , ,  ,  ,    CIWA:  CIWA-Ar Total: 4 COWS:     Musculoskeletal: Strength & Muscle Tone: within normal limits Gait & Station: normal Patient leans: N/A  Psychiatric Specialty Exam: Physical Exam  ROS  Blood pressure (!) 129/94, pulse 97, temperature (!) 97.4 F (36.3 C), temperature source Oral, resp. rate 18, height 6' (1.829 m), weight 82 kg, SpO2 100 %.Body mass index is 24.52 kg/m.  General Appearance: Casual  Eye Contact:  Good  Speech:  Clear and Coherent  Volume:  Normal  Mood:  Dysphoric  Affect:  Constricted  Thought Process:  Coherent and Descriptions of Associations: Intact  Orientation:  Full (Time, Place, and Person)  Thought Content:  Tangential  Suicidal Thoughts:  No  Homicidal Thoughts:  No  Memory:  Immediate;   Fair  Judgement:  Fair  Insight:  Good  Psychomotor Activity:  Normal  Concentration:  Concentration: Fair  Recall:  [employer] of Knowledge:  Fair  Language:  Good  Akathisia:  Negative  Handed:  Right  AIMS (if indicated):     Assets:  Physical Health Resilience Social Support  ADL's:  Intact  Cognition:  WNL  Sleep:  Number of Hours: 5.25     Treatment Plan Summary: Daily contact with patient to assess and evaluate symptoms and progress in treatment and Medication management showing improvement finally up and out of bed no thoughts of self-harm we will monitor another 24 hours begin Mysoline off label for a more protracted withdrawal syndrome  Jessah Danser, MD 07/17/2018, 7:52 AM

## 2018-07-17 NOTE — Progress Notes (Signed)
CSW spoke with patient on unit in anticipation of discharge.  Patient reports he is detoxing off ETOH and he "needs longer to be mentally stronger." CSW inquired if there are additional resources other than outpatient appointments with [facility] to make him feel more confident about discharge. Patient denies, says it does not matter.  Stephanie Acre, LCSW-A Clinical Social Worker

## 2018-07-17 NOTE — Progress Notes (Signed)
[facility] Group Notes:  (Nursing/MHT/Case Management/Adjunct)  Date:  07/17/2018  Time:  2030  Type of Therapy:  wrap up group  Participation Level:  Active  Participation Quality:  Appropriate, Attentive, Sharing and Supportive  Affect:  Appropriate  Cognitive:  Appropriate  Insight:  Improving  Engagement in Group:  Engaged  Modes of Intervention:  Clarification, Education and Support  Summary of Progress/Problems:   Thomas Combs 07/17/2018, 10:01 PM

## 2018-07-17 NOTE — Progress Notes (Signed)
Patient was pacing up and down the hallway, lightly punching the walls. Patient said he isn't getting the care he needs, and that we are "just shoving pills down his throat." Patient is frustrated because MD told patient one more day until discharge, which is when he started to escalate. A/C and MD notified. Patient was able to be verbally deescalated and said he would calm down without medicine. Will continue to monitor.

## 2018-07-18 MED ORDER — RISPERIDONE 1 MG PO TABS
ORAL_TABLET | ORAL | Status: AC
  Filled 2018-07-18: qty 1

## 2018-07-18 MED ORDER — TRAZODONE HCL 50 MG PO TABS
ORAL_TABLET | ORAL | Status: AC
  Filled 2018-07-18: qty 1

## 2018-07-18 MED ORDER — TRAZODONE HCL 50 MG PO TABS
50.0000 mg | ORAL_TABLET | Freq: Every evening | ORAL | Status: DC | PRN
  Administered 2018-07-18: 50 mg via ORAL
  Filled 2018-07-18: qty 1

## 2018-07-18 MED ORDER — FLUOXETINE HCL 20 MG PO CAPS: 20.0000 mg | ORAL_CAPSULE | Freq: Every day | ORAL | 1 refills | Status: DC

## 2018-07-18 MED ORDER — HYDROXYZINE HCL 50 MG PO TABS
ORAL_TABLET | ORAL | Status: AC
  Administered 2018-07-18: 50 mg
  Filled 2018-07-18: qty 1

## 2018-07-18 MED ORDER — HYDROXYZINE HCL 50 MG PO TABS
50.0000 mg | ORAL_TABLET | Freq: Once | ORAL | Status: AC
  Filled 2018-07-18: qty 1

## 2018-07-18 MED ORDER — GABAPENTIN 300 MG PO CAPS: 300.0000 mg | ORAL_CAPSULE | Freq: Three times a day (TID) | ORAL | 2 refills | Status: DC

## 2018-07-18 MED ORDER — RISPERIDONE 1 MG PO TABS
1.0000 mg | ORAL_TABLET | Freq: Once | ORAL | Status: AC
  Administered 2018-07-18: 1 mg via ORAL
  Filled 2018-07-18: qty 1

## 2018-07-18 MED ORDER — GABAPENTIN 300 MG PO CAPS
300.0000 mg | ORAL_CAPSULE | Freq: Three times a day (TID) | ORAL | Status: DC
  Administered 2018-07-18 – 2018-07-21 (×10): 300 mg via ORAL
  Filled 2018-07-18: qty 21
  Filled 2018-07-18: qty 1
  Filled 2018-07-18 (×5): qty 21
  Filled 2018-07-18: qty 1
  Filled 2018-07-18 (×3): qty 21
  Filled 2018-07-18: qty 1
  Filled 2018-07-18: qty 21
  Filled 2018-07-18: qty 1
  Filled 2018-07-18: qty 21
  Filled 2018-07-18: qty 1
  Filled 2018-07-18: qty 21
  Filled 2018-07-18 (×2): qty 1
  Filled 2018-07-18: qty 21

## 2018-07-18 NOTE — Progress Notes (Signed)
[facility] MD Progress Note  07/18/2018 10:33 AM Thomas IdlerBruce A Combs  MRN:  [id number] Subjective:   Patient processes his disruptive behavior yesterday stating he "just wants 2 more days of sobriety" probably homeless but will not give a specific address though [place].  Alert and oriented less irritable denies wanting to harm self or others no acute withdrawal other than some continued hypertensive but low pulse rate.  Probably baseline Principal Problem: Alcoholism reports depression probable issues of secondary gain Diagnosis: Active Problems:   Severe recurrent major depression without psychotic features (HCC)   Alcohol dependence with uncomplicated intoxication (H[facility])  Total Time spent with patient: 20 minutes  Past Medical History:  Past Medical History:  Diagnosis Date  . Medical history non-contributory    History reviewed. No pertinent surgical history. Family History:  Family History  Problem Relation Age of Onset  . Diabetes Mother   Social History:  Social History   Substance and Sexual Activity  Alcohol Use Yes   Comment: pt will not answer     Social History   Substance and Sexual Activity  Drug Use No   Comment: pt will not answer    Social History   Socioeconomic History  . Marital status: Single    Spouse name: Not on file  . Number of children: Not on file  . Years of education: Not on file  . Highest education level: Not on file  Occupational History  . Not on file  Social Needs  . Financial resource strain: Not on file  . Food insecurity    Worry: Not on file    Inability: Not on file  . Transportation needs    Medical: Not on file    Non-medical: Not on file  Tobacco Use  . Smoking status: Current Every Day Smoker    Packs/day: 0.50    Types: Cigarettes  . Smokeless tobacco: Never Used  Substance and Sexual Activity  . Alcohol use: Yes    Comment: pt will not answer  . Drug use: No    Comment: pt will not answer  . Sexual  activity: Not on file  Lifestyle  . Physical activity    Days per week: Not on file    Minutes per session: Not on file  . Stress: Not on file  Relationships  . Social Musicianconnections    Talks on phone: Not on file    Gets together: Not on file    Attends religious service: Not on file    Active member of club or organization: Not on file    Attends meetings of clubs or organizations: Not on file    Relationship status: Not on file  Other Topics Concern  . Not on file  Social History Narrative  . Not on file   Additional Social History:                         Sleep: Good  Appetite:  Good  Current Medications: Current Facility-Administered Medications  Medication Dose Route Frequency Provider Last Rate Last Dose  . acetaminophen (TYLENOL) tablet 650 mg  650 mg Oral Q6H PRN Nira ConnBerry, Jason A, NP      . alum & mag hydroxide-simeth (MAALOX/MYLANTA) 200-200-20 MG/5ML suspension 30 mL  30 mL Oral Q4H PRN Nira ConnBerry, Jason A, NP      . FLUoxetine (PROZAC) capsule 20 mg  20 mg Oral Daily Malvin JohnsFarah, Ebubechukwu Jedlicka, MD   20 mg at 07/18/18 0758  .  gabapentin (NEURONTIN) capsule 300 mg  300 mg Oral TID Johnn Hai, MD      . hydrOXYzine (ATARAX/VISTARIL) tablet 25 mg  25 mg Oral TID PRN Rozetta Nunnery, NP   25 mg at 07/17/18 2115  . magnesium hydroxide (MILK OF MAGNESIA) suspension 30 mL  30 mL Oral Daily PRN Lindon Romp A, NP      . nicotine (NICODERM CQ - dosed in mg/24 hours) patch 21 mg  21 mg Transdermal Daily Cobos, Myer Peer, MD   21 mg at 07/18/18 0758  . traZODone (DESYREL) tablet 50 mg  50 mg Oral QHS PRN Lindon Romp A, NP   50 mg at 07/17/18 2204    Lab Results: No results found for this or any previous visit (from the past 48 hour(s)).  Blood Alcohol level:  Lab Results  Component Value Date   ETH 190 (H) 09/32/6712    Metabolic Disorder Labs: No results found for: HGBA1C, MPG No results found for: PROLACTIN Lab Results  Component Value Date   CHOL 128 07/15/2018   TRIG 125  07/15/2018   HDL 53 07/15/2018   CHOLHDL 2.4 07/15/2018   VLDL 25 07/15/2018   LDLCALC 50 07/15/2018    Physical Findings: AIMS:  , ,  ,  ,    CIWA:  CIWA-Ar Total: 4 COWS:     Musculoskeletal: Strength & Muscle Tone: within normal limits Gait & Station: normal Patient leans: N/A  Psychiatric Specialty Exam: Physical Exam  ROS  Blood pressure (!) 138/98, pulse (!) 52, temperature 97.9 F (36.6 C), temperature source Oral, resp. rate 20, height 6' (1.829 m), weight 82 kg, SpO2 100 %.Body mass index is 24.52 kg/m.  General Appearance: Disheveled  Eye Contact:  Good  Speech:  Clear and Coherent  Volume:  Decreased  Mood:  Dysphoric  Affect:  Appropriate  Thought Process:  Goal Directed  Orientation:  Full (Time, Place, and Person)  Thought Content:  Logical  Suicidal Thoughts:  No  Homicidal Thoughts:  No  Memory:  Immediate;   Fair  Judgement:  Fair  Insight:  Fair  Psychomotor Activity:  Normal  Concentration:  Concentration: Fair  Recall:  [place] of Knowledge:  Good  Language:  Good  Akathisia:  Negative  Handed:  Right  AIMS (if indicated):     Assets:  Physical Health Resilience Social Support  ADL's:  Intact  Cognition:  WNL  Sleep:  Number of Hours: 5.5     Treatment Plan Summary: Daily contact with patient to assess and evaluate symptoms and progress in treatment continue current rehab groups continue current detox regimen add gabapentin plan for discharge tomorrow and will put the orders and patient understands this will be his last day  Johnn Hai, MD 07/18/2018, 10:33 AM

## 2018-07-18 NOTE — Progress Notes (Signed)
Patient ID: Thomas Combs, male   DOB: 1990-11-02, 28 y.o.   MRN: [id number]  D: Patient is seen visible on the unit with bright animated affect and silly/pleasant mood. Patient is discharging tomorrow and verbalizes understanding. Patient denies SI/HI/AVH and is not withdrawing at this time. A: Safety maintained with q15 minute checks. Medications provided as ordered. Low fall risk precautions in place.  R: Patient remains safe on the unit at this time.

## 2018-07-18 NOTE — Progress Notes (Addendum)
Pt cut his L wrist after he verbally contracted for safety with Probation officer.  Abrasions are superficial.  He was asked what he used to harm self, and he states "nothing."  He had previously denied SI/HI, hallucinations, and pain.  He states he did this because "I don't know.  A voice in my head just told me to do it."  Pt is unable to contract for safety.  On-site provider contacted for orders.  Risperdal 1 mg POX1 and Vistaril 50 mg POX1 was ordered and administered.  Order for 1:1 obtained.  1:1 staff is with pt for safety.  Pt is safe.

## 2018-07-19 DIAGNOSIS — R440 Auditory hallucinations: Secondary | ICD-10-CM

## 2018-07-19 DIAGNOSIS — F1094 Alcohol use, unspecified with alcohol-induced mood disorder: Secondary | ICD-10-CM

## 2018-07-19 MED ORDER — TRAZODONE HCL 100 MG PO TABS
100.0000 mg | ORAL_TABLET | Freq: Every evening | ORAL | Status: DC | PRN
  Administered 2018-07-19 – 2018-07-20 (×2): 100 mg via ORAL
  Filled 2018-07-19 (×2): qty 1

## 2018-07-19 MED ORDER — NICOTINE POLACRILEX 2 MG MT GUM
2.0000 mg | CHEWING_GUM | OROMUCOSAL | Status: DC | PRN
  Administered 2018-07-19 – 2018-07-20 (×5): 2 mg via ORAL
  Filled 2018-07-19 (×2): qty 1

## 2018-07-19 MED ORDER — FLUOXETINE HCL 20 MG PO CAPS
40.0000 mg | ORAL_CAPSULE | Freq: Every day | ORAL | Status: DC
  Administered 2018-07-20: 40 mg via ORAL
  Filled 2018-07-19: qty 2
  Filled 2018-07-19 (×2): qty 14

## 2018-07-19 NOTE — Progress Notes (Signed)
Pt is resting in bed with eyes closed.  Respirations are even and unlabored.  No distress noted.  1:1 staff remains with pt for safety.  Pt is safe.  Will continue to monitor and assess.  

## 2018-07-19 NOTE — Progress Notes (Signed)
[facility] Group Notes:  (Nursing/MHT/Case Management/Adjunct)  Date:  07/19/2018  Time:  2030  Type of Therapy:  wrap up group  Participation Level:  Active  Participation Quality:  Appropriate, Attentive, Sharing and Supportive  Affect:  Appropriate  Cognitive:  Appropriate  Insight:  Improving  Engagement in Group:  Engaged  Modes of Intervention:  Clarification, Education and Support  Summary of Progress/Problems:  Thomas Combs 07/19/2018, 9:09 PM
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
## 2018-07-19 NOTE — Progress Notes (Signed)
Pt is resting in bed with eyes closed.  Respirations are even and unlabored.  No distress noted.  1:1 staff remains with pt for safety.  Pt is safe.

## 2018-07-19 NOTE — Progress Notes (Signed)
[place] NOVEL CORONAVIRUS (COVID-19) DAILY CHECK-OFF SYMPTOMS - answer yes or no to each - every day NO YES  Have you had a fever in the past 24 hours?  . Fever (Temp > 37.80C / 100F) X   Have you had any of these symptoms in the past 24 hours? . New Cough .  Sore Throat  .  Shortness of Breath .  Difficulty Breathing .  Unexplained Body Aches   X   Have you had any one of these symptoms in the past 24 hours not related to allergies?   . Runny Nose .  Nasal Congestion .  Sneezing   X   If you have had runny nose, nasal congestion, sneezing in the past 24 hours, has it worsened?  X   EXPOSURES - check yes or no X   Have you traveled outside the state in the past 14 days?  X   Have you been in contact with someone with a confirmed diagnosis of COVID-19 or PUI in the past 14 days without wearing appropriate PPE?  X   Have you been living in the same home as a person with confirmed diagnosis of COVID-19 or a PUI (household contact)?    X   Have you been diagnosed with COVID-19?    X              What to do next: Answered NO to all: Answered YES to anything:   Proceed with unit schedule Follow the [facility] Inpatient Flowsheet.   

## 2018-07-19 NOTE — Progress Notes (Signed)
D. Pt presents with an anxious affect/ mood- per pt's self inventory, pt rated his depression, hopelessness and anxiety all 9's. Pt writes that his goal today is "being normal" and "stay to myself" Pt reports having had AH today- mumbling- pt voices concern about this as this is new for him.  Pt currently denies SI/HI and VH A. Labs and vitals monitored. Pt compliant with medications. Pt supported emotionally and encouraged to express concerns and ask questions.   R. Pt remains safe with 15 minute checks. Will continue POC.
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
## 2018-07-19 NOTE — [facility] Group Notes (Signed)
[facility] Group Notes:  (Nursing)  Date:  07/19/2018  Time:200 PM Type of Therapy:  Nurse Education  Participation Level:  Active  Participation Quality:  Appropriate  Affect:  Appropriate  Cognitive:  Appropriate  Insight:  Appropriate  Engagement in Group:  Engaged  Modes of Intervention:  Discussion and Education  Summary of Progress/Problems:  Group played a Retail banker game that fosters listening skills as well as self expression.   Waymond Cera 07/19/2018, 7:19 PM

## 2018-07-19 NOTE — Progress Notes (Signed)
  [facility] Adult Case Management Discharge Plan :  Will you be returning to the same living situation after discharge:  Yes,  with family At discharge, do you have transportation home?: Yes,  patient states he has this covered Do you have the ability to pay for your medications: No.  Release of information consent forms completed and turned in to Medical Records by CSW.   Patient to Follow up at: Follow-up Information    [facility] Follow up on 07/29/2018.   Why: Hospital discharge appointment is Tuesday, 7/7 at 1:00p.  Please wear a mask.  Please bring your photo ID, current medications, and discharge paperwork from this hospitalization.  Contact information: [address] [place] [address] [phone]           Next level of care provider has access to [facility] and Suicide Prevention discussed: No.  Patient declined, so it was reviewed with him instead  Have you used any form of tobacco in the last 30 days? (Cigarettes, Smokeless Tobacco, Cigars, and/or Pipes): Yes  Has patient been referred to the Quitline?: Patient refused referral  Patient has been referred for addiction treatment: Yes  Maretta Los, LCSW 07/19/2018, 8:47 AM

## 2018-07-20 MED ORDER — MIRTAZAPINE 7.5 MG PO TABS
7.5000 mg | ORAL_TABLET | Freq: Every day | ORAL | Status: DC
  Administered 2018-07-20: 7.5 mg via ORAL
  Filled 2018-07-20: qty 1
  Filled 2018-07-20: qty 7
  Filled 2018-07-20: qty 1

## 2018-07-20 MED ORDER — ARIPIPRAZOLE 2 MG PO TABS
2.0000 mg | ORAL_TABLET | Freq: Every day | ORAL | Status: DC
  Administered 2018-07-20 – 2018-07-21 (×2): 2 mg via ORAL
  Filled 2018-07-20 (×5): qty 1

## 2018-07-20 NOTE — Progress Notes (Signed)
[place] NOVEL CORONAVIRUS (COVID-19) DAILY CHECK-OFF SYMPTOMS - answer yes or no to each - every day NO YES  Have you had a fever in the past 24 hours?  . Fever (Temp > 37.80C / 100F) X   Have you had any of these symptoms in the past 24 hours? . New Cough .  Sore Throat  .  Shortness of Breath .  Difficulty Breathing .  Unexplained Body Aches   X   Have you had any one of these symptoms in the past 24 hours not related to allergies?   . Runny Nose .  Nasal Congestion .  Sneezing   X   If you have had runny nose, nasal congestion, sneezing in the past 24 hours, has it worsened?  X   EXPOSURES - check yes or no X   Have you traveled outside the state in the past 14 days?  X   Have you been in contact with someone with a confirmed diagnosis of COVID-19 or PUI in the past 14 days without wearing appropriate PPE?  X   Have you been living in the same home as a person with confirmed diagnosis of COVID-19 or a PUI (household contact)?    X   Have you been diagnosed with COVID-19?    X              What to do next: Answered NO to all: Answered YES to anything:   Proceed with unit schedule Follow the [facility] Inpatient Flowsheet.   

## 2018-07-20 NOTE — Progress Notes (Signed)
D. Pt presents with a sad affect congruent with mood- reports that he's feeling down today. Per pt's self inventory, pt rated his depression, hopelessness and anxiety a 10/26/08, respectively. Pt writes that his goal today is "keeping good vibes". Pt reports that he hasn't had an appetite since taking his antidepressant.  Pt verbally denies SI, but wrote on self inventory that he "sometimes has thoughts". A. Labs and vitals monitored. Pt compliant withmedications. Pt supported emotionally and encouraged to express concerns and ask questions.   R. Pt remains safe with 15 minute checks. Will continue POC.

## 2018-07-20 NOTE — Progress Notes (Signed)
D: Pt passive SI-contracts for safety. denies HI/AV. Pt is pleasant and cooperative.  A: Pt was offered support and encouragement. Pt was given scheduled medications. Pt was encourage to attend groups. Q 15 minute checks were done for safety.  R:Pt attends groups and interacts well with peers and staff. Pt is taking medication. Pt has no complaints.Pt receptive to treatment and safety maintained on unit.  Problem: Activity: Goal: Interest or engagement in leisure activities will improve Outcome: Progressing   Problem: Activity: Goal: Imbalance in normal sleep/wake cycle will improve Outcome: Progressing   Problem: Coping: Goal: Coping ability will improve Outcome: Progressing

## 2018-07-20 NOTE — Progress Notes (Signed)
D: Pt denies SI/HI/AVH. Pt is pleasant and cooperative. Pt stated he was having anxiety where pt received 25 mg Vistaril per [facility] and pt was educated on breathing techniques.  A: Pt was offered support and encouragement. Pt was given scheduled medications. Pt was encourage to attend groups. Q 15 minute checks were done for safety.  R:Pt attends groups and interacts well with peers and staff. Pt is taking medication. Pt has no complaints.Pt receptive to treatment and safety maintained on unit.  Problem: Activity: Goal: Interest or engagement in leisure activities will improve Outcome: Progressing   Problem: Activity: Goal: Imbalance in normal sleep/wake cycle will improve Outcome: Progressing

## 2018-07-20 NOTE — [facility] Group Notes (Signed)
Thomas Lopez LCSW Group Therapy Note  Date/Time:  07/20/2018 9:00-10:00 or 10:00-11:00AM  Type of Therapy and Topic:  Group Therapy:  Healthy and Unhealthy Supports  Participation Level:  Active   Description of Group:  Patients in this group were introduced to the idea of adding a variety of healthy supports to address the various needs in their lives.Patients discussed what additional healthy supports could be helpful in their recovery and wellness after discharge in order to prevent future hospitalizations.   An emphasis was placed on using counselor, doctor, therapy groups, 12-step groups, and problem-specific support groups to expand supports.  They also worked as a group on developing a specific plan for several patients to deal with unhealthy supports through [place], psychoeducation with loved ones, and even termination of relationships.   Therapeutic Goals:   1)  discuss importance of adding supports to stay well once out of the hospital  2)  compare healthy versus unhealthy supports and identify some examples of each  3)  generate ideas and descriptions of healthy supports that can be added  4)  offer mutual support about how to address unhealthy supports  5)  encourage active participation in and adherence to discharge plan    Summary of Patient Progress:  The patient stated that current healthy supports in hislife are the people he has met in the hospital while current unhealthy supports include is himself.  The patient identified adding 12 step meetings and therapy as supports to help in his recovery journey but expressed uncertainty as he is unsure he will be able to follow through like he needs. He does state that he has a two year old daughter and he knows that he wants to be there for her.   Therapeutic Modalities:   Motivational Interviewing Brief Solution-Focused Therapy  Rolanda Jay
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
## 2018-07-21 MED ORDER — MIRTAZAPINE 15 MG PO TABS: 15.0000 mg | ORAL_TABLET | Freq: Every day | ORAL | 2 refills | Status: DC

## 2018-07-21 NOTE — Progress Notes (Addendum)
  [facility] Adult Case Management Discharge Plan :  Will you be returning to the same living situation after discharge:  Yes,  with grandmother At discharge, do you have transportation home?: Yes, fellow pt. Do you have the ability to pay for your medications: No. Will work with [employer].   Release of information consent forms completed and in the chart;  Patient's signature needed at discharge.  Patient to Follow up at: Follow-up Information    [facility] Follow up on 07/29/2018.   Why: Hospital discharge appointment is Tuesday, 7/7 at 1:00p.  Please wear a mask.  Please bring your photo ID, current medications, and discharge paperwork from this hospitalization.  Contact information: [address] [place]  [address] [phone]           Next level of care provider has access to [place] and Suicide Prevention discussed: No.Pt declined consent.  Have you used any form of tobacco in the last 30 days? (Cigarettes, Smokeless Tobacco, Cigars, and/or Pipes): Yes  Has patient been referred to the Quitline?: Patient refused referral  Patient has been referred for addiction treatment: Yes, [facility].   Joanne Chars, LCSW 07/21/2018, 11:46 AM

## 2018-07-21 NOTE — Progress Notes (Signed)
Patient ID: Thomas Combs, male   DOB: 1990-04-07, 28 y.o.   MRN: [id number] Pt d/c to home via bus. D/c instructions and medications given and reviewed.

## 2018-07-21 NOTE — [facility] Suicide Risk Assessment (Signed)
[facility] Discharge Suicide Risk Assessment   Principal Problem: Substance abuse and homelessness probable Discharge Diagnoses: Active Problems:   Severe recurrent major depression without psychotic features (HCC)   Alcohol dependence with uncomplicated intoxication ([place])   Total Time spent with patient: 45 minutes  Musculoskeletal: Strength & Muscle Tone: within normal limits Gait & Station: normal Patient leans: N/A  Psychiatric Specialty Exam: ROS  Blood pressure 128/84, pulse 83, temperature 98.1 F (36.7 C), temperature source Oral, resp. rate 16, height 6' (1.829 m), weight 82 kg, SpO2 100 %.Body mass index is 24.52 kg/m.  General Appearance: Casual  Eye Contact::  Good  Speech:  Clear and Coherent409  Volume:  Normal  Mood:  Dysphoric  Affect:  Congruent  Thought Process:  Coherent  Orientation:  Full (Time, Place, and Person)  Thought Content:  Tangential  Suicidal Thoughts:  No  Homicidal Thoughts:  No  Memory:  NA  Judgement:  Fair  Insight:  Fair  Psychomotor Activity:  Normal  Concentration:  Good  Recall:  Good  Fund of Knowledge:Good  Language: Good  Akathisia:  Negative  Handed:  Right  AIMS (if indicated):     Assets:  Communication Skills Housing possibly  Sleep:  Number of Hours: 6.75  Cognition: WNL  ADL's:  Intact   Mental Status Per Nursing Assessment::   On Admission:  Suicidal ideation indicated by patient  Demographic Factors:  Male  Loss Factors: Decrease in vocational status  Historical Factors: Impulsivity  Risk Reduction Factors:   Religious beliefs about death  Continued Clinical Symptoms:  Alcohol/Substance Abuse/Dependencies  Cognitive Features That Contribute To Risk:  Polarized thinking    Suicide Risk:  Minimal: No identifiable suicidal ideation.  Patients presenting with no risk factors but with morbid ruminations; may be classified as minimal risk based on the severity of the depressive symptoms  Follow-up  Information    [facility] Follow up on 07/29/2018.   Why: Hospital discharge appointment is Tuesday, 7/7 at 1:00p.  Please wear a mask.  Please bring your photo ID, current medications, and discharge paperwork from this hospitalization.  Contact information:  [place] [address] [phone]           Plan Of Care/Follow-up recommendations:  Activity:  full  Smokey Melott, MD 07/21/2018, 7:41 AM

## 2018-07-21 NOTE — Progress Notes (Addendum)
Spiritual care group on grief and loss facilitated by chaplain Jerene Pitch  Group Goal:  Support / Education around grief and loss Members engage in facilitated group support and psycho-social education.  Group Description:  Following introductions and group rules, group members engaged in facilitated group dialog and support around topic of loss, with particular support around experiences of loss in their lives. Group Identified types of loss (relationships / self / things) and identified patterns, circumstances, and changes that precipitate losses. Reflected on thoughts / feelings around loss, normalized grief responses, and recognized variety in grief experience. Patient Progress: Present throughout group.  Actively engage in discussion. SPoke with group members about strategies after discharge today.  Specifically spoke about topic of creating new community and new normal for himself.

## 2018-07-21 NOTE — Progress Notes (Signed)
Recreation Therapy Notes  Date:  6.29.20 Time: 0930 Location: [address] Dayroom  Group Topic: Stress Management  Goal Area(s) Addresses:  Patient will identify positive stress management techniques. Patient will identify benefits of using stress management post d/c.  Behavioral Response: Engaged  Intervention: Stress Management  Activity :  Meditation.  LRT introduced the stress management technique of meditation.  LRT played a meditation that focused on taking on the stillness of the mountain.  Education:  Stress Management, Discharge Planning.   Education Outcome: Acknowledges Education  Clinical Observations/Feedback:  Pt attended and participated in group.    Victorino Sparrow, LRT/CTRS     Victorino Sparrow A 07/21/2018 10:39 AM
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
## 2018-08-17 ENCOUNTER — Emergency Department (HOSPITAL_COMMUNITY)
Admission: EM | Admit: 2018-08-17 | Discharge: 2018-08-18 | Disposition: A | Discharge status: Other Acute Inpatient Hospital | Payer: Self-pay | Attending: Emergency Medicine | Admitting: Emergency Medicine

## 2018-08-17 ENCOUNTER — Encounter (HOSPITAL_COMMUNITY): Payer: Self-pay | Admitting: Emergency Medicine

## 2018-08-17 ENCOUNTER — Other Ambulatory Visit: Payer: Self-pay

## 2018-08-17 DIAGNOSIS — F10920 Alcohol use, unspecified with intoxication, uncomplicated: Secondary | ICD-10-CM

## 2018-08-17 DIAGNOSIS — F329 Major depressive disorder, single episode, unspecified: Secondary | ICD-10-CM

## 2018-08-17 DIAGNOSIS — Z03818 Encounter for observation for suspected exposure to other biological agents ruled out: Secondary | ICD-10-CM | POA: Insufficient documentation

## 2018-08-17 DIAGNOSIS — Y907 Blood alcohol level of 200-239 mg/100 ml: Secondary | ICD-10-CM | POA: Insufficient documentation

## 2018-08-17 DIAGNOSIS — Z91199 Patient's noncompliance with other medical treatment and regimen due to unspecified reason: Secondary | ICD-10-CM

## 2018-08-17 DIAGNOSIS — F32A Depression, unspecified: Secondary | ICD-10-CM

## 2018-08-17 DIAGNOSIS — R45851 Suicidal ideations: Secondary | ICD-10-CM

## 2018-08-17 DIAGNOSIS — F333 Major depressive disorder, recurrent, severe with psychotic symptoms: Secondary | ICD-10-CM | POA: Insufficient documentation

## 2018-08-17 DIAGNOSIS — Z9119 Patient's noncompliance with other medical treatment and regimen: Secondary | ICD-10-CM

## 2018-08-17 DIAGNOSIS — F1020 Alcohol dependence, uncomplicated: Secondary | ICD-10-CM | POA: Insufficient documentation

## 2018-08-17 LAB — COMPREHENSIVE METABOLIC PANEL
ALT: 32 U/L (ref 0–44)
AST: 29 U/L (ref 15–41)
Albumin: 4.5 g/dL (ref 3.5–5.0)
Alkaline Phosphatase: 70 U/L (ref 38–126)
Anion gap: 15 (ref 5–15)
BUN: 10 mg/dL (ref 6–20)
CO2: 16 mmol/L — ABNORMAL LOW (ref 22–32)
Calcium: 8.9 mg/dL (ref 8.9–10.3)
Chloride: 107 mmol/L (ref 98–111)
Creatinine, Ser: 1.08 mg/dL (ref 0.61–1.24)
GFR calc Af Amer: >60 mL/min (ref >60)
GFR calc non Af Amer: >60 mL/min (ref >60)
Glucose, Bld: 98 mg/dL (ref 70–99)
Potassium: 2.9 mmol/L — ABNORMAL LOW (ref 3.5–5.1)
Sodium: 138 mmol/L (ref 135–145)
Total Bilirubin: 0.7 mg/dL (ref 0.3–1.2)
Total Protein: 7.6 g/dL (ref 6.5–8.1)

## 2018-08-17 LAB — CBC
HCT: 48.7 % (ref 39.0–52.0)
Hemoglobin: 16.2 g/dL (ref 13.0–17.0)
MCH: 31.2 pg (ref 26.0–34.0)
MCHC: 33.3 g/dL (ref 30.0–36.0)
MCV: 93.7 fL (ref 80.0–100.0)
Platelets: 134 10*3/uL — ABNORMAL LOW (ref 150–400)
RBC: 5.2 MIL/uL (ref 4.22–5.81)
RDW: 13.3 % (ref 11.5–15.5)
WBC: 8.4 10*3/uL (ref 4.0–10.5)
nRBC: 0 % (ref 0.0–0.2)

## 2018-08-17 LAB — SALICYLATE LEVEL: Salicylate Lvl: <7 mg/dL (ref 2.8–30.0)

## 2018-08-17 LAB — ACETAMINOPHEN LEVEL: Acetaminophen (Tylenol), Serum: <10 ug/mL — ABNORMAL LOW (ref 10–30)

## 2018-08-17 LAB — ETHANOL: Alcohol, Ethyl (B): 236 mg/dL — ABNORMAL HIGH (ref <10)

## 2018-08-17 MED ORDER — SODIUM CHLORIDE 0.9 % IV BOLUS
1000.0000 mL | Freq: Once | INTRAVENOUS | Status: AC
  Administered 2018-08-17: 1000 mL via INTRAVENOUS

## 2018-08-17 MED ORDER — LORAZEPAM 2 MG/ML IJ SOLN
INTRAMUSCULAR | Status: AC
  Filled 2018-08-17: qty 1

## 2018-08-17 MED ORDER — POTASSIUM CHLORIDE 10 MEQ/100ML IV SOLN
10.0000 meq | INTRAVENOUS | Status: AC
  Administered 2018-08-17 – 2018-08-18 (×4): 10 meq via INTRAVENOUS
  Filled 2018-08-17 (×4): qty 100

## 2018-08-17 MED ORDER — ZIPRASIDONE MESYLATE 20 MG IM SOLR
20.0000 mg | Freq: Once | INTRAMUSCULAR | Status: AC
  Administered 2018-08-17: 22:00:00 20 mg via INTRAMUSCULAR

## 2018-08-17 MED ORDER — LORAZEPAM 2 MG/ML IJ SOLN: 2.0000 mg | Freq: Once | INTRAMUSCULAR | Status: DC
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
## 2018-08-17 NOTE — ED Notes (Signed)
Pt mother called and states he was recently discharged from [facility] and ran out of his meds. She states she is scared he is going to hurt himself or someone else.

## 2018-08-18 ENCOUNTER — Other Ambulatory Visit: Payer: Self-pay | Admitting: Behavioral Health

## 2018-08-18 ENCOUNTER — Encounter (HOSPITAL_COMMUNITY): Payer: Self-pay | Admitting: *Deleted

## 2018-08-18 ENCOUNTER — Inpatient Hospital Stay (HOSPITAL_COMMUNITY)
Admission: AD | Admit: 2018-08-18 | Discharge: 2018-08-21 | DRG: 881 | Disposition: A | Discharge status: Home / Self Care | Payer: Federal, State, Local not specified - Other | Source: Intra-hospital | Attending: Psychiatry | Admitting: Psychiatry

## 2018-08-18 DIAGNOSIS — R45851 Suicidal ideations: Secondary | ICD-10-CM | POA: Diagnosis present

## 2018-08-18 DIAGNOSIS — F329 Major depressive disorder, single episode, unspecified: Secondary | ICD-10-CM | POA: Diagnosis present

## 2018-08-18 DIAGNOSIS — F17219 Nicotine dependence, cigarettes, with unspecified nicotine-induced disorders: Secondary | ICD-10-CM | POA: Diagnosis present

## 2018-08-18 DIAGNOSIS — F332 Major depressive disorder, recurrent severe without psychotic features: Secondary | ICD-10-CM | POA: Diagnosis not present

## 2018-08-18 DIAGNOSIS — Y908 Blood alcohol level of 240 mg/100 ml or more: Secondary | ICD-10-CM | POA: Diagnosis present

## 2018-08-18 LAB — SARS CORONAVIRUS 2 BY RT PCR (HOSPITAL ORDER, PERFORMED IN ~~LOC~~ HOSPITAL LAB): SARS Coronavirus 2: NEGATIVE

## 2018-08-18 MED ORDER — ADULT MULTIVITAMIN W/MINERALS CH
1.0000 | ORAL_TABLET | Freq: Every day | ORAL | Status: DC
  Administered 2018-08-19 – 2018-08-21 (×3): 1 via ORAL
  Filled 2018-08-18 (×5): qty 1

## 2018-08-18 MED ORDER — TRAZODONE HCL 50 MG PO TABS
50.0000 mg | ORAL_TABLET | Freq: Every evening | ORAL | Status: DC | PRN
  Administered 2018-08-18 – 2018-08-20 (×3): 50 mg via ORAL
  Filled 2018-08-18 (×3): qty 1

## 2018-08-18 MED ORDER — NICOTINE POLACRILEX 2 MG MT GUM: 2.0000 mg | CHEWING_GUM | OROMUCOSAL | Status: DC | PRN

## 2018-08-18 MED ORDER — ONDANSETRON 4 MG PO TBDP: 4.0000 mg | ORAL_TABLET | Freq: Four times a day (QID) | ORAL | Status: DC | PRN

## 2018-08-18 MED ORDER — HYDROXYZINE HCL 25 MG PO TABS
25.0000 mg | ORAL_TABLET | Freq: Four times a day (QID) | ORAL | Status: DC | PRN
  Administered 2018-08-18: 25 mg via ORAL
  Filled 2018-08-18 (×6): qty 1

## 2018-08-18 MED ORDER — ACETAMINOPHEN 325 MG PO TABS: 650.0000 mg | ORAL_TABLET | Freq: Four times a day (QID) | ORAL | Status: DC | PRN

## 2018-08-18 MED ORDER — LOPERAMIDE HCL 2 MG PO CAPS: 2.0000 mg | ORAL_CAPSULE | ORAL | Status: DC | PRN

## 2018-08-18 MED ORDER — THIAMINE HCL 100 MG/ML IJ SOLN
100.0000 mg | Freq: Once | INTRAMUSCULAR | Status: AC
  Administered 2018-08-18: 100 mg via INTRAMUSCULAR
  Filled 2018-08-18: qty 2

## 2018-08-18 MED ORDER — LORAZEPAM 1 MG PO TABS: 1.0000 mg | ORAL_TABLET | Freq: Four times a day (QID) | ORAL | Status: DC | PRN

## 2018-08-18 MED ORDER — ALUM & MAG HYDROXIDE-SIMETH 200-200-20 MG/5ML PO SUSP: 30.0000 mL | ORAL | Status: DC | PRN

## 2018-08-18 MED ORDER — VITAMIN B-1 100 MG PO TABS
100.0000 mg | ORAL_TABLET | Freq: Every day | ORAL | Status: DC
  Administered 2018-08-19 – 2018-08-21 (×3): 100 mg via ORAL
  Filled 2018-08-18 (×5): qty 1

## 2018-08-18 NOTE — ED Notes (Signed)
Father Jance Siek Sr. Phone number given by [employer] [phone]. Also home phone [phone]. Staff unsure which is correct.

## 2018-08-18 NOTE — [facility] Counselor (Signed)
Disposition: Mordecai Maes, NP recommends in patient treatment. TTS to secure placement.

## 2018-08-18 NOTE — Progress Notes (Signed)
Pt admitted to [facility] ivc after making a suicide attempt by cutting his wrist and was combative in the ED. Pt reports that he was at [facility] in the past and stoped his medication due to it not helping. Pt has been cutting himself on and off since age 28 when his best friend died. Pt says that he some times has voices that tell him to "do stupid shit." Pt lives with his GM and his father has schitzophrenia. Pt wants help with anxiety, depression, insomnia and substance abuse.

## 2018-08-18 NOTE — Plan of Care (Signed)
D: Denies SI, HI, AVH, and verbally contracts for safety.    A: Medications administered per MD order. Support provided. Patient educated on safety on the unit and medications. Routine safety checks every 15 minutes. Patient stated understanding to tell nurse about any new physical symptoms. Patient understands to tell staff of any needs.     R: No adverse drug reactions noted. Patient verbally contracts for safety. Patient remains safe at this time and will continue to monitor.   Problem: Education: Goal: Knowledge of [facility] [facility] Education information/materials will improve Outcome: Progressing   [place] NOVEL CORONAVIRUS (COVID-19) DAILY CHECK-OFF SYMPTOMS - answer yes or no to each - every day NO YES  Have you had a fever in the past 24 hours?  Fever (Temp > 37.80C / 100F) X   Have you had any of these symptoms in the past 24 hours? New Cough  Sore Throat   Shortness of Breath  Difficulty Breathing  Unexplained Body Aches   X   Have you had any one of these symptoms in the past 24 hours not related to allergies?   Runny Nose  Nasal Congestion  Sneezing   X   If you have had runny nose, nasal congestion, sneezing in the past 24 hours, has it worsened?  X   EXPOSURES - check yes or no X   Have you traveled outside the state in the past 14 days?  X   Have you been in contact with someone with a confirmed diagnosis of COVID-19 or PUI in the past 14 days without wearing appropriate PPE?  X   Have you been living in the same home as a person with confirmed diagnosis of COVID-19 or a PUI (household contact)?    X   Have you been diagnosed with COVID-19?    X              What to do next: Answered NO to all: Answered YES to anything:   Proceed with unit schedule Follow the [facility] Inpatient Flowsheet.    

## 2018-08-18 NOTE — Progress Notes (Signed)
Pt accepted to [facility], Bed 303-1  Mordecai Maes, NP is the accepting provider.  Neita Garnet, MD is the attending provider.  Call report to [phone]  @[facility]  ED notified.   Pt is IVC   Pt may be transported by [employer] Pt scheduled  to arrive at [facility] as soon as transport can be arranged  Romie Minus T. Judi Cong, MSW, [facility] Disposition Clinical Social Work [phone] (cell) [phone] (office)
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
## 2018-08-18 NOTE — ED Notes (Signed)
Breakfast given to patient.
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
## 2018-08-18 NOTE — ED Notes (Signed)
TTS in progress 

## 2018-08-18 NOTE — ED Notes (Signed)
TTS placed in room with patient. Awaiting call.

## 2018-08-19 LAB — LIPID PANEL
Cholesterol: 113 mg/dL (ref 0–200)
HDL: 45 mg/dL (ref >40)
LDL Cholesterol: 50 mg/dL (ref 0–99)
Total CHOL/HDL Ratio: 2.5 RATIO
Triglycerides: 90 mg/dL (ref <150)
VLDL: 18 mg/dL (ref 0–40)

## 2018-08-19 LAB — TSH: TSH: 2.255 u[IU]/mL (ref 0.350–4.500)

## 2018-08-19 LAB — HEMOGLOBIN A1C
Hgb A1c MFr Bld: 5.2 % (ref 4.8–5.6)
Mean Plasma Glucose: 102.54 mg/dL

## 2018-08-19 MED ORDER — CHLORDIAZEPOXIDE HCL 25 MG PO CAPS: 25.0000 mg | ORAL_CAPSULE | Freq: Every day | ORAL | Status: DC

## 2018-08-19 MED ORDER — HYDROXYZINE HCL 25 MG PO TABS
25.0000 mg | ORAL_TABLET | Freq: Four times a day (QID) | ORAL | Status: DC | PRN
  Administered 2018-08-19 – 2018-08-21 (×5): 25 mg via ORAL
  Filled 2018-08-19: qty 1

## 2018-08-19 MED ORDER — CHLORDIAZEPOXIDE HCL 25 MG PO CAPS
25.0000 mg | ORAL_CAPSULE | Freq: Three times a day (TID) | ORAL | Status: AC
  Administered 2018-08-20 (×3): 25 mg via ORAL
  Filled 2018-08-19 (×3): qty 1

## 2018-08-19 MED ORDER — ACAMPROSATE CALCIUM 333 MG PO TBEC
666.0000 mg | DELAYED_RELEASE_TABLET | Freq: Three times a day (TID) | ORAL | Status: DC
  Administered 2018-08-19 – 2018-08-21 (×7): 666 mg via ORAL
  Filled 2018-08-19: qty 42
  Filled 2018-08-19 (×4): qty 2
  Filled 2018-08-19: qty 42
  Filled 2018-08-19: qty 2
  Filled 2018-08-19 (×3): qty 42
  Filled 2018-08-19 (×3): qty 2
  Filled 2018-08-19: qty 42
  Filled 2018-08-19 (×2): qty 2

## 2018-08-19 MED ORDER — CHLORDIAZEPOXIDE HCL 25 MG PO CAPS
25.0000 mg | ORAL_CAPSULE | Freq: Four times a day (QID) | ORAL | Status: AC
  Administered 2018-08-19 (×3): 25 mg via ORAL
  Filled 2018-08-19 (×3): qty 1

## 2018-08-19 MED ORDER — NICOTINE 21 MG/24HR TD PT24
21.0000 mg | MEDICATED_PATCH | Freq: Every day | TRANSDERMAL | Status: DC
  Administered 2018-08-19 – 2018-08-21 (×3): 21 mg via TRANSDERMAL
  Filled 2018-08-19 (×5): qty 1

## 2018-08-19 MED ORDER — CHLORDIAZEPOXIDE HCL 25 MG PO CAPS
25.0000 mg | ORAL_CAPSULE | ORAL | Status: DC
  Administered 2018-08-21: 25 mg via ORAL
  Filled 2018-08-19: qty 1

## 2018-08-19 MED ORDER — FLUOXETINE HCL 20 MG PO CAPS
20.0000 mg | ORAL_CAPSULE | Freq: Every day | ORAL | Status: DC
  Administered 2018-08-19 – 2018-08-21 (×3): 20 mg via ORAL
  Filled 2018-08-19: qty 1
  Filled 2018-08-19: qty 7
  Filled 2018-08-19 (×4): qty 1
  Filled 2018-08-19: qty 7

## 2018-08-19 NOTE — Care Management (Signed)
CMA sent referral to [facility].  CMA will follow up. CMA will notify [place], Baldo Ash.        Yilia Sacca Care Management Assistant  Email:[email or web address] Office: [phone]

## 2018-08-19 NOTE — Plan of Care (Signed)
Nurse discussed anxiety, depression, coping skills with patient. 

## 2018-08-19 NOTE — H&P (Signed)
Psychiatric Admission Assessment Adult  Patient Identification: Thomas Combs MRN:  [id number] Date of Evaluation:  08/19/2018 Chief Complaint:  mdd alcohol use disorder Principal Diagnosis: Alcohol dependence/emotional dyscontrol/depressive symptoms/suicidal gestures Diagnosis:  Active Problems:   MDD (major depressive disorder)  History of Present Illness:   This is a repeat Thomas Combs here about a month ago with a very similar presentation, alcohol intoxication and self-harm gestures.  He was very difficult to deal with last time in fact making superficial scratch on his wrist in order to stay longer and refusing discharge somewhat of a tantrum at one point in time but he presented with a blood of level of 264 stating his brother stopped him from cutting his wrist.  Apparently these occurrences are only when he is intoxicated.  He was also combative in the ED but he is calm now.  He denies current thoughts of harming himself contracts here only states he would like some rehab.  According to the assessment team note of 7/27 Thomas Combs is an 28 y.o. male presenting voluntarily to [facility] due to SI. Patient placed under IVC due to reporting active SI and becoming combative with ED security staff, requiring sedation. Upon this clinician's exam patient is alert and oriented. He appears irritable, however is cooperative with assessment process. Patient states last night he was attempting to kill himself by cutting his wrists when his brother stopped him and brought him to the ED. Patient has superficial cuts to forearm. He endorses depressive symptoms of hopelessness, anhedonia, worthlessness, irritability, social isolation, insomnia, excessive guilt, and poor appetite. He denies HI. Patient reports AH of a deep voice that "tells me to do things." Patient [facility] June [facility] following a suicide attempt. He reports drinking 1 12 pack of beer and several shots of liquor daily. He  also reports opiate use. Patient's BAL was 264 upon arrival. Sample for UDS has not yet been collected at time of assessment. Patient states that his father is schizophrenic. He denies any criminal charges or trauma history. Patient gave verbal consent for TTS to contact his grandmother or father if necessary.  Patient is alert and oriented x 4. He is dressed in scrubs, sitting up right in bed. His speech is logical, eye contact is good, and thoughts are organized. Patient's mood is depressed and affect is congruent. Patient's insight, judgement, and impulse control are impaired. He does not appear to be responding to internal stimuli or experiencing delusional thought content  Associated Signs/Symptoms: Depression Symptoms:  anhedonia, (Hypo) Manic Symptoms:  Distractibility, Anxiety Symptoms:  n/a Psychotic Symptoms:  n/a PTSD Symptoms: Had a traumatic exposure:  denied Total Time spent with patient: 45 minutes  Past Psychiatric History: last here 1 month ago  Is the patient at risk to self? Yes.    Has the patient been a risk to self in the past 6 months? Yes.    Has the patient been a risk to self within the distant past? No.  Is the patient a risk to others? No.  Has the patient been a risk to others in the past 6 months? No.  Has the patient been a risk to others within the distant past? No.   Alcohol Screening: 1. How often do you have a drink containing alcohol?: 4 or more times a week 2. How many drinks containing alcohol do you have on a typical day when you are drinking?: 10 or more 3. How often do you have  six or more drinks on one occasion?: Daily or almost daily AUDIT-C Score: 12 4. How often during the last year have you found that you were not able to stop drinking once you had started?: Weekly 5. How often during the last year have you failed to do what was normally expected from you becasue of drinking?: Weekly 6. How often during the last year have you needed a first  drink in the morning to get yourself going after a heavy drinking session?: Less than monthly 8. How often during the last year have you been unable to remember what happened the night before because you had been drinking?: Daily or almost daily 9. Have you or someone else been injured as a result of your drinking?: Yes, during the last year 10. Has a relative or friend or a doctor or another health worker been concerned about your drinking or suggested you cut down?: Yes, during the last year Alcohol Use Disorder Identification Test Final Score (AUDIT): 31 Alcohol Brief Interventions/Follow-up: Alcohol Education Substance Abuse History in the last 12 months:  Yes.   Consequences of Substance Abuse: NA Previous Psychotropic Medications: Yes  Psychological Evaluations: No  Past Medical History:  Past Medical History:  Diagnosis Date  . Medical history non-contributory    History reviewed. No pertinent surgical history. Family History:  Family History  Problem Relation Age of Onset  . Diabetes Mother    Family Psychiatric  History: no new data Tobacco Screening: Have you used any form of tobacco in the last 30 days? (Cigarettes, Smokeless Tobacco, Cigars, and/or Pipes): Yes Tobacco use, Select all that apply: 5 or more cigarettes per day Are you interested in Tobacco Cessation Medications?: Yes, will notify MD for an order Counseled patient on smoking cessation including recognizing danger situations, developing coping skills and basic information about quitting provided: Refused/Declined practical counseling Social History:  Social History   Substance and Sexual Activity  Alcohol Use Yes   Comment: pt will not answer     Social History   Substance and Sexual Activity  Drug Use No   Comment: pt will not answer    Additional Social History:                           Allergies:   Allergies  Allergen Reactions  . Penicillins    Lab Results:  Results for orders  placed or performed during the hospital encounter of 08/18/18 (from the past 48 hour(s))  Hemoglobin A1c     Status: None   Collection Time: 08/19/18  6:19 AM  Result Value Ref Range   Hgb A1c MFr Bld 5.2 4.8 - 5.6 %    Comment: (NOTE) Pre diabetes:          5.7%-6.4% Diabetes:              >6.4% Glycemic control for   <7.0% adults with diabetes    Mean Plasma Glucose 102.54 mg/dL    Comment: Performed at [facility] [address] [place], [place] [address]  Lipid panel     Status: None   Collection Time: 08/19/18  6:19 AM  Result Value Ref Range   Cholesterol 113 0 - 200 mg/dL   Triglycerides 90 <150 mg/dL   HDL 45 >40 mg/dL   Total CHOL/HDL Ratio 2.5 RATIO   VLDL 18 0 - 40 mg/dL   LDL Cholesterol 50 0 - 99 mg/dL    Comment:  Total Cholesterol/HDL:CHD Risk Coronary Heart Disease Risk Table                     Men   Women  1/2 Average Risk   3.4   3.3  Average Risk       5.0   4.4  2 X Average Risk   9.6   7.1  3 X Average Risk  23.4   11.0        Use the calculated Patient Ratio above and the CHD Risk Table to determine the patient's CHD Risk.        ATP III CLASSIFICATION (LDL):  <100     mg/dL   Optimal  [id number]  mg/dL   Near or Above                    Optimal  130-159  mg/dL   Borderline  [id number]  mg/dL   High  >782>190     mg/dL   Very High Performed at [facility], [address] [place], [place] [address]   TSH     Status: None   Collection Time: 08/19/18  6:19 AM  Result Value Ref Range   TSH 2.255 0.350 - 4.500 uIU/mL    Comment: Performed by a 3rd Generation assay with a functional sensitivity of <=0.01 uIU/mL. Performed at [facility], [address] [place], [place] [address]     Blood Alcohol level:  Lab Results  Component Value Date   ETH 236 (H) 08/17/2018   ETH 190 (H) 07/14/2018    Metabolic Disorder Labs:  Lab Results  Component Value Date   HGBA1C 5.2 08/19/2018   MPG 102.54  08/19/2018   No results found for: PROLACTIN Lab Results  Component Value Date   CHOL 113 08/19/2018   TRIG 90 08/19/2018   HDL 45 08/19/2018   CHOLHDL 2.5 08/19/2018   VLDL 18 08/19/2018   LDLCALC 50 08/19/2018   LDLCALC 50 07/15/2018    Current Medications: Current Facility-Administered Medications  Medication Dose Route Frequency Provider Last Rate Last Dose  . acamprosate (CAMPRAL) tablet 666 mg  666 mg Oral TID WC Malvin JohnsFarah, Wave Calzada, MD      . acetaminophen (TYLENOL) tablet 650 mg  650 mg Oral Q6H PRN Denzil Magnusonhomas, Lashunda, NP      . alum & mag hydroxide-simeth (MAALOX/MYLANTA) 200-200-20 MG/5ML suspension 30 mL  30 mL Oral Q4H PRN Denzil Magnusonhomas, Lashunda, NP      . chlordiazePOXIDE (LIBRIUM) capsule 25 mg  25 mg Oral QID Malvin JohnsFarah, Dezaree Tracey, MD       Followed by  . [START ON 08/20/2018] chlordiazePOXIDE (LIBRIUM) capsule 25 mg  25 mg Oral TID Malvin JohnsFarah, Johnatha Zeidman, MD       Followed by  . [START ON 08/21/2018] chlordiazePOXIDE (LIBRIUM) capsule 25 mg  25 mg Oral Nicki GuadalajaraBH-qamhs Dolphus Linch, MD       Followed by  . [START ON 08/23/2018] chlordiazePOXIDE (LIBRIUM) capsule 25 mg  25 mg Oral Daily Malvin JohnsFarah, Vikrant Pryce, MD      . FLUoxetine (PROZAC) capsule 20 mg  20 mg Oral Daily Malvin JohnsFarah, Undra Harriman, MD      . hydrOXYzine (ATARAX/VISTARIL) tablet 25 mg  25 mg Oral Q6H PRN Denzil Magnusonhomas, Lashunda, NP   25 mg at 08/18/18 2136  . hydrOXYzine (ATARAX/VISTARIL) tablet 25 mg  25 mg Oral Q6H PRN Malvin JohnsFarah, Akaash Vandewater, MD      . loperamide (IMODIUM) capsule 2-4 mg  2-4 mg Oral PRN  Denzil Magnusonhomas, Lashunda, NP      . multivitamin with minerals tablet 1 tablet  1 tablet Oral Daily Denzil Magnusonhomas, Lashunda, NP   1 tablet at 08/19/18 0759  . nicotine (NICODERM CQ - dosed in mg/24 hours) patch 21 mg  21 mg Transdermal Daily Cobos, Rockey SituFernando A, MD   21 mg at 08/19/18 0759  . ondansetron (ZOFRAN-ODT) disintegrating tablet 4 mg  4 mg Oral Q6H PRN Denzil Magnusonhomas, Lashunda, NP      . thiamine (VITAMIN B-1) tablet 100 mg  100 mg Oral Daily Denzil Magnusonhomas, Lashunda, NP   100 mg at 08/19/18 0759  .  traZODone (DESYREL) tablet 50 mg  50 mg Oral QHS PRN Nira ConnBerry, Jason A, NP   50 mg at 08/18/18 2136   PTA Medications: No medications prior to admission.    Musculoskeletal: Strength & Muscle Tone: within normal limits Gait & Station: normal Patient leans: N/A  Psychiatric Specialty Exam: Physical Exam  Nursing note and vitals reviewed. Constitutional: He appears well-developed and well-nourished.    Review of Systems  Constitutional: Negative.   Eyes: Negative.   Respiratory: Negative.   Cardiovascular: Negative.   Genitourinary: Negative.   Musculoskeletal: Negative.   Neurological: Negative.   Endo/Heme/Allergies: Negative.     Blood pressure 121/80, pulse (!) 59, temperature 98.2 F (36.8 C), temperature source Oral, resp. rate 16, height 5\' 11"  (1.803 m), weight 85.3 kg.Body mass index is 26.22 kg/m.  General Appearance: Casual  Eye Contact:  Good  Speech:  Clear and Coherent  Volume:  Normal  Mood:  Dysphoric  Affect:  Congruent  Thought Process:  Linear and Descriptions of Associations: Tangential  Orientation:  Full (Time, Place, and Person)  Thought Content:  Logical  Suicidal Thoughts:  Recent neg now  Homicidal Thoughts:  No  Memory:  Immediate;   Fair  Judgement:  Fair  Insight:  Fair  Psychomotor Activity:  Normal  Concentration:  Concentration: Fair  Recall:  [employer]  Fund of Knowledge:  Fair  Language:  Fair  Akathisia:  Negative  Handed:  Right  AIMS (if indicated):     Assets:  Communication Skills Desire for Improvement  ADL's:  Intact  Cognition:  WNL  Sleep:  Number of Hours: 6.5    Treatment Plan Summary: Daily contact with patient to assess and evaluate symptoms and progress in treatment and Medication management  Observation Level/Precautions:  15 minute checks  Laboratory:  UDS  Psychotherapy: Cognitive and rehab based  Medications: Campral plus antidepressant plus detox protocol  Consultations: Not necessary  Discharge Concerns:  Longer-term sobriety  Estimated LOS: 3-5  Other: 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/recent volatility and violence while intoxicated/alcohol dependence/cannabis abuse/benzodiazepine abuse   Physician Treatment Plan for Primary Diagnosis: <principal problem not specified> Long Term Goal(s): Improvement in symptoms so as ready for discharge  Short Term Goals: Ability to identify and develop effective coping behaviors will improve, Ability to maintain clinical measurements within normal limits will improve and Compliance with prescribed medications will improve  Physician Treatment Plan for Secondary Diagnosis: Active Problems:   MDD (major depressive disorder)  Long Term Goal(s): Improvement in symptoms so as ready for discharge  Short Term Goals: Ability to maintain clinical measurements within normal limits will improve, Compliance with prescribed medications will improve and Ability to identify triggers associated with substance abuse/mental health issues will improve  I certify that inpatient services furnished can reasonably be expected to improve the patient's condition.    Malvin JohnsFARAH,Giovani Neumeister, MD 7/28/202010:22 AM

## 2018-08-19 NOTE — [facility] Suicide Risk Assessment (Signed)
[facility] INPATIENT:  Family/Significant Other Suicide Prevention Education  Suicide Prevention Education:  Patient Refusal for Family/Significant Other Suicide Prevention Education: The patient Thomas Combs has refused to provide written consent for family/significant other to be provided Family/Significant Other Suicide Prevention Education during admission and/or prior to discharge.  Physician notified.  Joellen Jersey 08/19/2018, 11:34 AM

## 2018-08-19 NOTE — [facility] Suicide Risk Assessment (Signed)
[facility] Admission Suicide Risk Assessment   Nursing information obtained from:  Patient Demographic factors:  Male, Adolescent or young adult, Unemployed Current Mental Status:  Suicidal ideation indicated by patient, Self-harm behaviors Loss Factors:  Financial problems / change in socioeconomic status Historical Factors:  Prior suicide attempts, Family history of mental illness or substance abuse Risk Reduction Factors:  Living with another person, especially a relative, Positive therapeutic relationship  Total Time spent with patient: 45 minutes Principal Problem: Alcohol intoxication self-harm and violence towards others  diagnosis:  Active Problems:   MDD (major depressive disorder)  Subjective Data-readmitted  Continued Clinical Symptoms:  Alcohol Use Disorder Identification Test Final Score (AUDIT): 31 The "Alcohol Use Disorders Identification Test", Guidelines for Use in Primary Care, Second Edition.  World Pharmacologist [facility]). Score between 0-7:  no or low risk or alcohol related problems. Score between 8-15:  moderate risk of alcohol related problems. Score between 16-19:  high risk of alcohol related problems. Score 20 or above:  warrants further diagnostic evaluation for alcohol dependence and treatment.  Musculoskeletal: Strength & Muscle Tone: within normal limits Gait & Station: normal Patient leans: N/A  Psychiatric Specialty Exam: Physical Exam  Nursing note and vitals reviewed. Constitutional: He appears well-developed and well-nourished.    Review of Systems  Constitutional: Negative.   Eyes: Negative.   Respiratory: Negative.   Cardiovascular: Negative.   Genitourinary: Negative.   Musculoskeletal: Negative.   Neurological: Negative.   Endo/Heme/Allergies: Negative.     Blood pressure 121/80, pulse (!) 59, temperature 98.2 F (36.8 C), temperature source Oral, resp. rate 16, height 5\' 11"  (1.803 m), weight 85.3 kg.Body mass index is 26.22 kg/m.   General Appearance: Casual  Eye Contact:  Good  Speech:  Clear and Coherent  Volume:  Normal  Mood:  Dysphoric  Affect:  Congruent  Thought Process:  Linear and Descriptions of Associations: Tangential  Orientation:  Full (Time, Place, and Person)  Thought Content:  Logical  Suicidal Thoughts:  Recent neg now  Homicidal Thoughts:  No  Memory:  Immediate;   Fair  Judgement:  Fair  Insight:  Fair  Psychomotor Activity:  Normal  Concentration:  Concentration: Fair  Recall:  [employer] of Knowledge:  Fair  Language:  Fair  Akathisia:  Negative  Handed:  Right  AIMS (if indicated):     Assets:  Communication Skills Desire for Improvement  ADL's:  Intact  Cognition:  WNL  Sleep:  Number of Hours: 6.5    CLINICAL FACTORS:   Alcohol/Substance Abuse/Dependencies   COGNITIVE FEATURES THAT CONTRIBUTE TO RISK:  Polarized thinking    SUICIDE RISK:   Moderate:  Frequent suicidal ideation with limited intensity, and duration, some specificity in terms of plans, no associated intent, good self-control, limited dysphoria/symptomatology, some risk factors present, and identifiable protective factors, including available and accessible social support.  PLAN OF CARE: detox- first  I certify that inpatient services furnished can reasonably be expected to improve the patient's condition.   Johnn Hai, MD 08/19/2018, 10:27 AM

## 2018-08-19 NOTE — [facility] Counselor (Signed)
Adult Comprehensive Assessment  Patient ID: Thomas Combs, male   DOB: Nov 06, 1990, 28 y.o.   MRN: [id number]   Information Source: Information source: Patient  Current Stressors: Patient states their primary concerns and needs for treatment are:: Was cutting himself, brother walked in and took him to the hospital. [facility] and stopped taking meds and started drinking heavier. 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 Mother lost custody of him as a child due to drug issues. Patient has a 850 year old daughter that lives with her mom. Financial / Lack of resources (include bankruptcy): Inconsistent and limited income, no insurance Housing / Lack of housing: Lives with grandmother, they argue often Physical health (include injuries & life threatening diseases): Good, denies stressors Social relationships: GF broke up with him last month. All his friends use drugs and alcohol. Substance abuse: Daily ETOH use, 12 pack of beer and a pint of liquor.  Bereavement / Loss: Has lost several friends to opioid use  Living/Environment/Situation: Living Arrangements: Other relatives Living conditions (as described by patient or guardian): Single family home in [place] Who else lives in the home?: Grandmother How long has patient lived in current situation?: 2 years What is atmosphere in current home: Chaotic  Family History: Marital status: Single Are you sexually active?: Yes What is your sexual orientation?: Straight Has your sexual activity been affected by drugs, alcohol, medication, or emotional stress?: Denies Does patient have  children?: Yes How many children?: 2 How is patient's relationship with their children?: 28 year old daughter, she lives with her mom  Childhood History: By whom was/is the patient raised?: Father Additional childhood history information: Mom had drug problems and lost custody, patient was raised by dad from the time he was a toddler Description of patient's relationship with caregiver when they were a child: Not much contact with mom, good with dad Patient's description of current relationship with people who raised him/her: Not much contact with mom, good with dad- dad lives in [place] How were you disciplined when you got in trouble as a child/adolescent?: Appropriate Does patient have siblings?: Yes Number of Siblings: 6 Description of patient's current relationship with siblings: 1 full brother that lives in [place]. 2 half brothers and 3 half sisters in [place]- Good relationships Did patient suffer any verbal/emotional/physical/sexual abuse as a child?: No Did patient suffer from severe childhood neglect?: No Has patient ever been sexually abused/assaulted/raped as an adolescent or adult?: No Was the patient ever a victim of a crime or a disaster?: No Witnessed domestic violence?: No Has patient been effected by domestic violence as an adult?: No  Education: Highest grade of school patient has completed: Dropped out in 11th grade Currently a student?: No Learning disability?: No  Employment/Work Situation: Employment situation: Unemployed Patient's job has been impacted by current illness: No What is the longest time patient has a held a job?: 2 years 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  If attempted suicide, did drugs/alcohol play a role in this?: No Alcohol/Substance Abuse Treatment Hx: Denies past history Has alcohol/substance abuse ever caused legal problems?: Got a DUI in 2019.  Social Support System: Patient's Community Support System: Fair Astronomer System: Family, friends Type of faith/religion: None How does patient's faith help to cope with current illness?: n/a  Leisure/Recreation: Leisure and Hobbies: Proofreader, fishing  Strengths/Needs: What is the patient's perception of their strengths?: Skateboarding, fishing Patient states they can use these personal strengths during their treatment to contribute to their recovery: unsure Patient states these barriers may affect/interfere with their treatment: denies Patient states these barriers may affect their return to the community: Isn't sure if he can go home  Discharge Plan: Currently receiving community mental health services: No Patient states concerns and preferences for aftercare planning are: Agreeable to residential treat referrals and Thomas Combs as a back up.  Patient states they will know when they are safe and ready for discharge when: Not sure Does patient have access to transportation?: Yes Does patient have financial barriers related to discharge medications?: Yes Patient description of barriers related to discharge medications: No income, no insurance Will patient be returning to same living situation after discharge?: Yes   Summary/Recommendations:   Summary and Recommendations (to be completed by the evaluator): Thomas Combs is a 27 year old male from [place] who presents to [facility] under IVC. Patient reports he brought to the hospital by his brother after he cut himself. Patient endorses ETOH abuse, other  stresses include financial stressors and conflict with his grandmother. Patient was last hospitalized at [facility] one month ago, he states little has changed since then. Patient will benefit from crisis stabilization, medication management, therapeutic miliue, and referral for services.  Joellen Jersey. 08/19/2018

## 2018-08-19 NOTE — Progress Notes (Signed)
D:  Patient's self inventory sheet, patient has poor sleep, sleep medication not helpful.  Fair appetite, low energy level, poor concentration.  Rated depression and anxiety 10, hopeless 5.  Withdrawals, tremors, chilling, runny nose.  Denied SI.  Denied physical problems.  Denied physical pain.  Goal is get over withdrawals.  Plans to sleep.  No discharge plans. A:  Medications administered per MD orders.  Emotional support and encouragement given patient. R:  Denied SI and HI, contracts for safety.  Denied A/V hallucinations.  Safety maintained with 15 minute checks.

## 2018-08-19 NOTE — Progress Notes (Signed)
Patient became upset after dinner.  Patient refused to discuss problem with MHT or nursing staff.  Patient hit wall in his room and refused to go to quiet room.  Charge nurse and 2nd shift staff informed of patient's behavior.   Patient sitting on floor at end of 300 hallway using squeeze balls.

## 2018-08-20 DIAGNOSIS — F332 Major depressive disorder, recurrent severe without psychotic features: Secondary | ICD-10-CM

## 2018-08-20 MED ORDER — TRAZODONE HCL 50 MG PO TABS
50.0000 mg | ORAL_TABLET | Freq: Every evening | ORAL | Status: DC | PRN
  Administered 2018-08-20: 50 mg via ORAL

## 2018-08-20 MED ORDER — TRAZODONE HCL 50 MG PO TABS
ORAL_TABLET | ORAL | Status: AC
  Filled 2018-08-20: qty 1

## 2018-08-20 NOTE — Plan of Care (Signed)
D: Pt alert and oriented on the unit. Pt engaging with RN staff and other pts. Pt denies SI/HI, A/VH. Pt is cooperative and participated during unit groups and activities.  A: Education, support and encouragement provided, q15 minute safety checks remain in effect. Medications administered per MD orders. R: No reactions/side effects to medicine noted. Pt denies any concerns at this time, and verbally contracts for safety. Pt ambulating on the unit with no issues. Pt remains safe on and off the unit.   Problem: Education: Goal: Mental status will improve Outcome: Progressing Goal: Verbalization of understanding the information provided will improve Outcome: Progressing

## 2018-08-20 NOTE — Progress Notes (Signed)
Recreation Therapy Notes  Date:  7.29.20 Time: 0930 Location: 300 Hall Dayroom  Group Topic: Stress Management  Goal Area(s) Addresses:  Patient will identify positive stress management techniques. Patient will identify benefits of using stress management post d/c.  Intervention: Stress Management  Activity :  Guided Imagery.  LRT introduced the stress management technique of guided imagery.  LRT read Combs script that took patients on Combs journey [place] the beach.  Patients were to listen and follow along as script was read to engage in activity.  Education:  Stress Management, Discharge Planning.   Education Outcome: Acknowledges Education  Clinical Observations/Feedback:  Pt did not attend group.     Thomas Combs, LRT/CTRS         Thomas Combs 08/20/2018 10:46 AM 

## 2018-08-20 NOTE — Tx Team (Signed)
Interdisciplinary Treatment and Diagnostic Plan Update  08/20/2018 Time of Session: 9:00am Thomas IdlerBruce A Combs MRN: [id number]  Principal Diagnosis: <principal problem not specified>  Secondary Diagnoses: Active Problems:   MDD (major depressive disorder)   Current Medications:  Current Facility-Administered Medications  Medication Dose Route Frequency Provider Last Rate Last Dose  . acamprosate (CAMPRAL) tablet 666 mg  666 mg Oral TID WC Malvin JohnsFarah, Brian, MD   666 mg at 08/20/18 0655  . acetaminophen (TYLENOL) tablet 650 mg  650 mg Oral Q6H PRN Denzil Magnusonhomas, Lashunda, NP      . alum & mag hydroxide-simeth (MAALOX/MYLANTA) 200-200-20 MG/5ML suspension 30 mL  30 mL Oral Q4H PRN Denzil Magnusonhomas, Lashunda, NP      . chlordiazePOXIDE (LIBRIUM) capsule 25 mg  25 mg Oral TID Malvin JohnsFarah, Brian, MD       Followed by  . [START ON 08/21/2018] chlordiazePOXIDE (LIBRIUM) capsule 25 mg  25 mg Oral Nicki GuadalajaraBH-qamhs Farah, Brian, MD       Followed by  . [START ON 08/22/2018] chlordiazePOXIDE (LIBRIUM) capsule 25 mg  25 mg Oral Daily Malvin JohnsFarah, Brian, MD      . FLUoxetine (PROZAC) capsule 20 mg  20 mg Oral Daily Malvin JohnsFarah, Brian, MD   20 mg at 08/19/18 1049  . hydrOXYzine (ATARAX/VISTARIL) tablet 25 mg  25 mg Oral Q6H PRN Denzil Magnusonhomas, Lashunda, NP   25 mg at 08/18/18 2136  . hydrOXYzine (ATARAX/VISTARIL) tablet 25 mg  25 mg Oral Q6H PRN Malvin JohnsFarah, Brian, MD   25 mg at 08/19/18 2257  . loperamide (IMODIUM) capsule 2-4 mg  2-4 mg Oral PRN Denzil Magnusonhomas, Lashunda, NP      . multivitamin with minerals tablet 1 tablet  1 tablet Oral Daily Denzil Magnusonhomas, Lashunda, NP   1 tablet at 08/19/18 0759  . nicotine (NICODERM CQ - dosed in mg/24 hours) patch 21 mg  21 mg Transdermal Daily Cobos, Rockey SituFernando A, MD   21 mg at 08/19/18 0759  . ondansetron (ZOFRAN-ODT) disintegrating tablet 4 mg  4 mg Oral Q6H PRN Denzil Magnusonhomas, Lashunda, NP      . thiamine (VITAMIN B-1) tablet 100 mg  100 mg Oral Daily Denzil Magnusonhomas, Lashunda, NP   100 mg at 08/19/18 0759  . traZODone (DESYREL) tablet 50 mg  50 mg Oral QHS  PRN Nira ConnBerry, Jason A, NP   50 mg at 08/19/18 2342   PTA Medications: No medications prior to admission.    Patient Stressors:    Patient Strengths:    Treatment Modalities: Medication Management, Group therapy, Case management,  1 to 1 session with clinician, Psychoeducation, Recreational therapy.   Physician Treatment Plan for Primary Diagnosis: <principal problem not specified> Long Term Goal(s): Improvement in symptoms so as ready for discharge Improvement in symptoms so as ready for discharge   Short Term Goals: Ability to identify and develop effective coping behaviors will improve Ability to maintain clinical measurements within normal limits will improve Compliance with prescribed medications will improve Ability to maintain clinical measurements within normal limits will improve Compliance with prescribed medications will improve Ability to identify triggers associated with substance abuse/mental health issues will improve  Medication Management: Evaluate patient's response, side effects, and tolerance of medication regimen.  Therapeutic Interventions: 1 to 1 sessions, Unit Group sessions and Medication administration.  Evaluation of Outcomes: Progressing  Physician Treatment Plan for Secondary Diagnosis: Active Problems:   MDD (major depressive disorder)  Long Term Goal(s): Improvement in symptoms so as ready for discharge Improvement in symptoms so as ready for discharge   Short Term  Goals: Ability to identify and develop effective coping behaviors will improve Ability to maintain clinical measurements within normal limits will improve Compliance with prescribed medications will improve Ability to maintain clinical measurements within normal limits will improve Compliance with prescribed medications will improve Ability to identify triggers associated with substance abuse/mental health issues will improve     Medication Management: Evaluate patient's response, side  effects, and tolerance of medication regimen.  Therapeutic Interventions: 1 to 1 sessions, Unit Group sessions and Medication administration.  Evaluation of Outcomes: Progressing   RN Treatment Plan for Primary Diagnosis: <principal problem not specified> Long Term Goal(s): Knowledge of disease and therapeutic regimen to maintain health will improve  Short Term Goals: Ability to demonstrate self-control, Ability to verbalize feelings will improve, Ability to identify and develop effective coping behaviors will improve and Compliance with prescribed medications will improve  Medication Management: RN will administer medications as ordered by provider, will assess and evaluate patient's response and provide education to patient for prescribed medication. RN will report any adverse and/or side effects to prescribing provider.  Therapeutic Interventions: 1 on 1 counseling sessions, Psychoeducation, Medication administration, Evaluate responses to treatment, Monitor vital signs and CBGs as ordered, Perform/monitor CIWA, COWS, AIMS and Fall Risk screenings as ordered, Perform wound care treatments as ordered.  Evaluation of Outcomes: Progressing   LCSW Treatment Plan for Primary Diagnosis: <principal problem not specified> Long Term Goal(s): Safe transition to appropriate next level of care at discharge, Engage patient in therapeutic group addressing interpersonal concerns.  Short Term Goals: Engage patient in aftercare planning with referrals and resources, Increase social support, Increase emotional regulation, Identify triggers associated with mental health/substance abuse issues and Increase skills for wellness and recovery  Therapeutic Interventions: Assess for all discharge needs, 1 to 1 time with Social worker, Explore available resources and support systems, Assess for adequacy in community support network, Educate family and significant other(s) on suicide prevention, Complete Psychosocial  Assessment, Interpersonal group therapy.  Evaluation of Outcomes: Progressing   Progress in Treatment: Attending groups: Yes. Participating in groups: Yes. Taking medication as prescribed: Yes. Toleration medication: Yes. Family/Significant other contact made: No, will contact:  patient declined consents, SPE reviewed with patient. Patient understands diagnosis: Yes. Discussing patient identified problems/goals with staff: Yes. Medical problems stabilized or resolved: Yes. Denies suicidal/homicidal ideation: Yes. Issues/concerns per patient self-inventory: Yes.  New problem(s) identified: Yes, Describe:  financial stressors, family conflict  New Short Term/Long Term Goal(s): detox, medication management for mood stabilization; elimination of SI thoughts; development of comprehensive mental wellness/sobriety plan.  Patient Goals:  Get medication to help with depression  Discharge Plan or Barriers: Referrals made for residential substance use treatment, patient expected to discharge home and follow up with [facility].  Reason for Continuation of Hospitalization: Anxiety Depression Suicidal ideation  Estimated Length of Stay: 1-2 days  Attendees: Patient: Thomas Combs 08/20/2018 8:46 AM  Physician: Dr.Farah 08/20/2018 8:46 AM  Nursing: Elberta Fortis, RN 08/20/2018 8:46 AM  RN Care Manager: 08/20/2018 8:46 AM  Social Worker: Stephanie Acre, [place] 08/20/2018 8:46 AM  Recreational Therapist:  08/20/2018 8:46 AM  Other:  08/20/2018 8:46 AM  Other:  08/20/2018 8:46 AM  Other: 08/20/2018 8:46 AM    Scribe for Treatment Team: Joellen Jersey, [place] 08/20/2018 8:46 AM

## 2018-08-20 NOTE — Progress Notes (Signed)
Patient ID: Thomas Combs, male   DOB: [birth year], 28 y.o.   MRN: [id number]   [place] NOVEL CORONAVIRUS (COVID-19) DAILY CHECK-OFF SYMPTOMS - answer yes or no to each - every day NO YES  Have you had a fever in the past 24 hours?  . Fever (Temp > 37.80C / 100F) X   Have you had any of these symptoms in the past 24 hours? . New Cough .  Sore Throat  .  Shortness of Breath .  Difficulty Breathing .  Unexplained Body Aches   X   Have you had any one of these symptoms in the past 24 hours not related to allergies?   . Runny Nose .  Nasal Congestion .  Sneezing   X   If you have had runny nose, nasal congestion, sneezing in the past 24 hours, has it worsened?  X   EXPOSURES - check yes or no X   Have you traveled outside the state in the past 14 days?  X   Have you been in contact with someone with a confirmed diagnosis of COVID-19 or PUI in the past 14 days without wearing appropriate PPE?  X   Have you been living in the same home as a person with confirmed diagnosis of COVID-19 or a PUI (household contact)?    X   Have you been diagnosed with COVID-19?    X              What to do next: Answered NO to all: Answered YES to anything:   Proceed with unit schedule Follow the [facility] Inpatient Flowsheet.

## 2018-08-20 NOTE — Care Management (Signed)
CMA spoke to [facility] with Admissions at [place]. Patient's referral has not been reviewed yet. Per Freida Busman, CMA should receive an answer about treatment today or Thursday 7/30.   CMA will continue to follow up and notify CSW, [place].      Braylin Xu Care Management Assistant  Email:[email or web address] Office: [phone]

## 2018-08-20 NOTE — Plan of Care (Signed)
D: Denies SI, HI, AVH, and verbally contracts for safety.    A: Medications administered per MD order. Support provided. Patient educated on safety on the unit and medications. Routine safety checks every 15 minutes. Patient stated understanding to tell nurse about any new physical symptoms. Patient understands to tell staff of any needs.     R: No adverse drug reactions noted. Patient verbally contracts for safety. Patient remains safe at this time and will continue to monitor.   Problem: Education: Goal: Knowledge of [facility] Education information/materials will improve Outcome: Progressing   [place] NOVEL CORONAVIRUS (COVID-19) DAILY CHECK-OFF SYMPTOMS - answer yes or no to each - every day NO YES  Have you had a fever in the past 24 hours?  Fever (Temp > 37.80C / 100F) X   Have you had any of these symptoms in the past 24 hours? New Cough  Sore Throat   Shortness of Breath  Difficulty Breathing  Unexplained Body Aches   X   Have you had any one of these symptoms in the past 24 hours not related to allergies?   Runny Nose  Nasal Congestion  Sneezing   X   If you have had runny nose, nasal congestion, sneezing in the past 24 hours, has it worsened?  X   EXPOSURES - check yes or no X   Have you traveled outside the state in the past 14 days?  X   Have you been in contact with someone with a confirmed diagnosis of COVID-19 or PUI in the past 14 days without wearing appropriate PPE?  X   Have you been living in the same home as a person with confirmed diagnosis of COVID-19 or a PUI (household contact)?    X   Have you been diagnosed with COVID-19?    X              What to do next: Answered NO to all: Answered YES to anything:   Proceed with unit schedule Follow the [facility] Inpatient Flowsheet.    

## 2018-08-20 NOTE — Progress Notes (Signed)
[facility] MD Progress Note  08/20/2018 8:20 AM Thomas Combs  MRN:  [id number] Subjective:   Patient seen in his room he is generally contained in mood his affect is congruent he denies thoughts of harming self today, is being prepared for discharge tomorrow or by the end of the week states he is "not better" but denies suicidal thoughts.  We went ahead and started a detox regimen though he minimizes drinking he did acknowledge drinking every day his blood pressure slightly up.  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 Medical history non-contributory    History reviewed. No pertinent surgical history. Family History:  Family History  Problem Relation Age of Onset  . Diabetes Mother    Family Psychiatric  History: no new Social History:  Social History   Substance and Sexual Activity  Alcohol Use Yes   Comment: pt will not answer     Social History   Substance and Sexual Activity  Drug Use No   Comment: pt will not answer    Social History   Socioeconomic History  . Marital status: Single    Spouse name: Not on file  . Number of children: Not on file  . Years of education: Not on file  . Highest education level: Not on file  Occupational History  . Not on file  Social Needs  . Financial resource strain: Not on file  . Food insecurity    Worry: Not on file    Inability: Not on file  . Transportation needs    Medical: Not on file    Non-medical: Not on file  Tobacco Use  . Smoking status: Current Every Day Smoker    Packs/day: 0.50    Types: Cigarettes  . Smokeless tobacco: Never Used  Substance and Sexual Activity  . Alcohol use: Yes    Comment: pt will not answer  . Drug use: No   Comment: pt will not answer  . Sexual activity: Yes    Birth control/protection: None  Lifestyle  . Physical activity    Days per week: Not on file    Minutes per session: Not on file  . Stress: Not on file  Relationships  . Social Herbalist on phone: Not on file    Gets together: Not on file    Attends religious service: Not on file    Active member of club or organization: Not on file    Attends meetings of clubs or organizations: Not on file    Relationship status: Not on file  Other Topics Concern  . Not on file  Social History Narrative  . Not on file   Additional Social History:                         Sleep: Fair  Appetite:  Fair  Current Medications: Current Facility-Administered Medications  Medication Dose Route Frequency Provider Last Rate Last Dose  . acamprosate (CAMPRAL) tablet 666 mg  666 mg Oral TID WC Johnn Hai, MD   666 mg at 08/20/18 0655  . acetaminophen (TYLENOL) tablet 650 mg  650 mg Oral Q6H PRN Mordecai Maes, NP      . alum & Iris Pert  hydroxide-simeth (MAALOX/MYLANTA) 200-200-20 MG/5ML suspension 30 mL  30 mL Oral Q4H PRN Denzil Magnusonhomas, Lashunda, NP      . chlordiazePOXIDE (LIBRIUM) capsule 25 mg  25 mg Oral TID Malvin JohnsFarah, Cecille Mcclusky, MD       Followed by  . [START ON 08/21/2018] chlordiazePOXIDE (LIBRIUM) capsule 25 mg  25 mg Oral Nicki GuadalajaraBH-qamhs Aarnav Steagall, MD       Followed by  . [START ON 08/22/2018] chlordiazePOXIDE (LIBRIUM) capsule 25 mg  25 mg Oral Daily Malvin JohnsFarah, Donnie Gedeon, MD      . FLUoxetine (PROZAC) capsule 20 mg  20 mg Oral Daily Malvin JohnsFarah, Liem Copenhaver, MD   20 mg at 08/19/18 1049  . hydrOXYzine (ATARAX/VISTARIL) tablet 25 mg  25 mg Oral Q6H PRN Denzil Magnusonhomas, Lashunda, NP   25 mg at 08/18/18 2136  . hydrOXYzine (ATARAX/VISTARIL) tablet 25 mg  25 mg Oral Q6H PRN Malvin JohnsFarah, Simmone Cape, MD   25 mg at 08/19/18 2257  . loperamide (IMODIUM) capsule 2-4 mg  2-4 mg Oral PRN Denzil Magnusonhomas, Lashunda, NP      . multivitamin with minerals tablet 1 tablet  1 tablet Oral Daily Denzil Magnusonhomas,  Lashunda, NP   1 tablet at 08/19/18 0759  . nicotine (NICODERM CQ - dosed in mg/24 hours) patch 21 mg  21 mg Transdermal Daily Cobos, Rockey SituFernando A, MD   21 mg at 08/19/18 0759  . ondansetron (ZOFRAN-ODT) disintegrating tablet 4 mg  4 mg Oral Q6H PRN Denzil Magnusonhomas, Lashunda, NP      . thiamine (VITAMIN B-1) tablet 100 mg  100 mg Oral Daily Denzil Magnusonhomas, Lashunda, NP   100 mg at 08/19/18 0759  . traZODone (DESYREL) tablet 50 mg  50 mg Oral QHS PRN Jackelyn PolingBerry, Jason A, NP   50 mg at 08/19/18 2342    Lab Results:  Results for orders placed or performed during the hospital encounter of 08/18/18 (from the past 48 hour(s))  Hemoglobin A1c     Status: None   Collection Time: 08/19/18  6:19 AM  Result Value Ref Range   Hgb A1c MFr Bld 5.2 4.8 - 5.6 %    Comment: (NOTE) Pre diabetes:          5.7%-6.4% Diabetes:              >6.4% Glycemic control for   <7.0% adults with diabetes    Mean Plasma Glucose 102.54 mg/dL    Comment: Performed at [facility], [address] [place], [place] [address]  Lipid panel     Status: None   Collection Time: 08/19/18  6:19 AM  Result Value Ref Range   Cholesterol 113 0 - 200 mg/dL   Triglycerides 90 <604<150 mg/dL   HDL 45 >54>40 mg/dL   Total CHOL/HDL Ratio 2.5 RATIO   VLDL 18 0 - 40 mg/dL   LDL Cholesterol 50 0 - 99 mg/dL    Comment:        Total Cholesterol/HDL:CHD Risk Coronary Heart Disease Risk Table                     Men   Women  1/2 Average Risk   3.4   3.3  Average Risk       5.0   4.4  2 X Average Risk   9.6   7.1  3 X Average Risk  23.4   11.0        Use the calculated Patient Ratio above and the CHD Risk Table to determine the patient's CHD Risk.  ATP III CLASSIFICATION (LDL):  <100     mg/dL   Optimal  [id number]  mg/dL   Near or Above                    Optimal  130-159  mg/dL   Borderline  [id number]  mg/dL   High  >562>190     mg/dL   Very High Performed at [facility], [address] [place], [place] [address]   TSH      Status: None   Collection Time: 08/19/18  6:19 AM  Result Value Ref Range   TSH 2.255 0.350 - 4.500 uIU/mL    Comment: Performed by a 3rd Generation assay with a functional sensitivity of <=0.01 uIU/mL. Performed at [facility], [address] [place], [place] [address]     Blood Alcohol level:  Lab Results  Component Value Date   ETH 236 (H) 08/17/2018   ETH 190 (H) 07/14/2018    Metabolic Disorder Labs: Lab Results  Component Value Date   HGBA1C 5.2 08/19/2018   MPG 102.54 08/19/2018   No results found for: PROLACTIN Lab Results  Component Value Date   CHOL 113 08/19/2018   TRIG 90 08/19/2018   HDL 45 08/19/2018   CHOLHDL 2.5 08/19/2018   VLDL 18 08/19/2018   LDLCALC 50 08/19/2018   LDLCALC 50 07/15/2018    Physical Findings: AIMS: Facial and Oral Movements Muscles of Facial Expression: None, normal Lips and Perioral Area: None, normal Jaw: None, normal Tongue: None, normal,Extremity Movements Upper (arms, wrists, hands, fingers): None, normal Lower (legs, knees, ankles, toes): None, normal, Trunk Movements Neck, shoulders, hips: None, normal, Overall Severity Severity of abnormal movements (highest score from questions above): None, normal Incapacitation due to abnormal movements: None, normal Patient's awareness of abnormal movements (rate only patient's report): No Awareness, Dental Status Current problems with teeth and/or dentures?: No Does patient usually wear dentures?: No  CIWA:  CIWA-Ar Total: 0 COWS:  COWS Total Score: 2  Musculoskeletal: Strength & Muscle Tone: within normal limits Gait & Station: normal Patient leans: N/A and Normal gait  Psychiatric Specialty Exam: Physical Exam  ROS  Blood pressure (!) 141/95, pulse 90, temperature 98.2 F (36.8 C), temperature source Oral, resp. rate 16, height 5\' 11"  (1.803 m), weight 85.3 kg.Body mass index is 26.22 kg/m.  General Appearance: Casual  Eye Contact:  Good  Speech:   Clear and Coherent  Volume:  Decreased  Mood:  Dysphoric  Affect:  Appropriate and Congruent  Thought Process:  Coherent and Descriptions of Associations: Intact  Orientation:  Full (Time, Place, and Person)  Thought Content:  Logical  Suicidal Thoughts:  No  Homicidal Thoughts:  No  Memory:  Immediate;   Poor- poor effort   Judgement:  Fair  Insight:  Fair  Psychomotor Activity:  Normal  Concentration:  Concentration: Good  Recall:  Good  Fund of Knowledge:  Good  Language:  Good  Akathisia:  Negative  Handed:  Right  AIMS (if indicated):     Assets:  Resilience Social Support  ADL's:  Intact  Cognition:  WNL  Sleep:  Number of Hours: 6.5     Treatment Plan Summary: Daily contact with patient to assess and evaluate symptoms and progress in treatment, Medication management and Plan Continue current detox measures continue fluoxetine continue cognitive and rehab based therapies continue Campral to hopefully aid with cravings upon discharge no change in precautions probable discharge 24 to 48 hours  Malvin JohnsFARAH,Adaira Centola, MD 08/20/2018, 8:20 AM

## 2018-08-21 MED ORDER — FLUOXETINE HCL 20 MG PO CAPS: 20.0000 mg | ORAL_CAPSULE | Freq: Every day | ORAL | 1 refills | Status: DC

## 2018-08-21 MED ORDER — ACAMPROSATE CALCIUM 333 MG PO TBEC: 666.0000 mg | DELAYED_RELEASE_TABLET | Freq: Three times a day (TID) | ORAL | 2 refills | Status: DC

## 2018-08-21 NOTE — [facility] Suicide Risk Assessment (Signed)
[place] INPATIENT:  Family/Significant Other Suicide Prevention Education  Suicide Prevention Education:  Patient Refusal for Family/Significant Other Suicide Prevention Education: The patient Thomas Combs has refused to provide written consent for family/significant other to be provided Family/Significant Other Suicide Prevention Education during admission and/or prior to discharge.  Physician notified.  Joellen Jersey 08/21/2018, 9:35 AM

## 2018-08-21 NOTE — Progress Notes (Signed)
  [facility] Adult Case Management Discharge Plan :  Will you be returning to the same living situation after discharge:  No. Going to [facility] for additional treatment.  At discharge, do you have transportation home?: Yes,  [employer]. 12:00PM Do you have the ability to pay for your medications: No. Provided samples.  Release of information consent forms completed and in the chart.  Patient to Follow up at: [facility] Follow up on 08/21/2018.   Why: You have been accepted for treatment on Thursday, 7/30 at 2:00p. Please bring your photo ID, 30 day supply of medictions and money.  Contact information: [place] [address] [phone]           Next level of care provider has access to [place] and Suicide Prevention discussed: Yes,  with patient.  Have you used any form of tobacco in the last 30 days? (Cigarettes, Smokeless Tobacco, Cigars, and/or Pipes): Yes  Has patient been referred to the Quitline?: Patient refused referral  Patient has been referred for addiction treatment: Yes  Joellen Jersey, [place] 08/21/2018, 9:36 AM

## 2018-08-21 NOTE — Discharge Summary (Signed)
Physician Discharge Summary Note  Patient:  Thomas Combs Boen is an 28 y.o., male MRN:  [id number] DOB:  06-03-1990 Patient phone:  [phone] (home)  Patient address:   [address] [place] [id number],  Total Time spent with patient: 15 minutes  Date of Admission:  08/18/2018 Date of Discharge: 08/21/2018  Reason for Admission:  Per admission assessment notes:Thomas Combs is an 28 y.o. male presenting voluntarily to [facility] due to SI. Patient placed under IVC due to reporting active SI and becoming combative with ED security staff, requiring sedation. Upon this clinician's exam patient is alert and oriented. He appears irritable, however is cooperative with assessment process. Patient states last night he was attempting to kill himself by cutting his wrists when his brother stopped him and brought him to the ED. Patient has superficial cuts to forearm. He endorses depressive symptoms of hopelessness, anhedonia, worthlessness, irritability, social isolation, insomnia, excessive guilt, and poor appetite. He denies HI. Patient reports AH of Combs deep voice that "tells me to do things." [facility] following Combs suicide attempt. He reports drinking 1 12 pack of beer and several shots of liquor daily. He also reports opiate use. Patient's BAL was 264 upon arrival. Sample for UDS has not yet been collected at time of assessment. Patient states that his father is schizophrenic. He denies any criminal charges or trauma history. Patient gave verbal consent for TTS to contact his grandmother or father if necessary.  Patient is alert and oriented x 4. He is dressed in scrubs, sitting up right in bed. His speech is logical, eye contact is good, and thoughts are organized. Patient's mood is depressed and affect is congruent. Patient's insight, judgement, and impulse control are impaired. He does not appear to be responding to internal stimuli or experiencing delusional  thought content.   Principal Problem: Major Depression MDD Discharge Diagnoses: Major Depression  Past Psychiatric History:  Past Medical History:  Past Medical History:  Diagnosis Date  . Medical history non-contributory    History reviewed. No pertinent surgical history. Family History:  Family History  Problem Relation Age of Onset  . Diabetes Mother    Family Psychiatric  History: See chart for details Social History:  Social History   Substance and Sexual Activity  Alcohol Use Yes   Comment: pt will not answer     Social History   Substance and Sexual Activity  Drug Use No   Comment: pt will not answer    Social History   Socioeconomic History  . Marital status: Single    Spouse name: Not on file  . Number of children: Not on file  . Years of education: Not on file  . Highest education level: Not on file  Occupational History  . Not on file  Social Needs  . Financial resource strain: Not on file  . Food insecurity    Worry: Not on file    Inability: Not on file  . Transportation needs    Medical: Not on file    Non-medical: Not on file  Tobacco Use  . Smoking status: Current Every Day Smoker    Packs/day: 0.50    Types: Cigarettes  . Smokeless tobacco: Never Used  Substance and Sexual Activity  . Alcohol use: Yes    Comment: pt will not answer  . Drug use: No    Comment: pt will not answer  . Sexual activity: Yes    Birth  control/protection: None  Lifestyle  . Physical activity    Days per week: Not on file    Minutes per session: Not on file  . Stress: Not on file  Relationships  . Social Musicianconnections    Talks on phone: Not on file    Gets together: Not on file    Attends religious service: Not on file    Active member of club or organization: Not on file    Attends meetings of clubs or organizations: Not on file    Relationship status: Not on file  Other Topics Concern  . Not on file  Social History Narrative  . Not on file     Hospital Course:  Thomas Combs was admitted for MDD (major depressive disorder).  Pt was treated discharged with the medications listed below under Medication List.  Medical problems were identified and treated as needed.  Home medications were restarted as appropriate.  Improvement was monitored by observation and Thomas Combs 's daily report of symptom reduction.  Emotional and mental status was monitored by daily self-inventory reports completed by Thomas Combs Ranum and clinical staff.         Thomas Combs was evaluated by the treatment team for stability and plans for continued recovery upon discharge. Thomas Combs 's motivation was an integral factor for scheduling further treatment. Employment, transportation, bed availability, health status, family support, and any pending legal issues were also considered during hospital stay. Pt was offered further treatment options upon discharge including but not limited to Residential, Intensive Outpatient, and Outpatient treatment.  Thomas Combs will follow up with the services as listed below under Follow Up Information.     Upon completion of this admission the patient was both mentally and medically stable for discharge denying suicidal/homicidal ideation, auditory/visual/tactile hallucinations, delusional thoughts and paranoia.     Thomas Combs responded well to treatment with acamprosate and fluoxetine without adverse effects.  Pt demonstrated improvement without reported or observed adverse effects to the point of stability appropriate for outpatient management. Pertinent labs include: BAL 236 for which no outpatient follow-up is necessary for lab. Reviewed CBC, CMP  all unremarkable aside from noted exceptions.   Physical Findings: AIMS: Facial and Oral Movements Muscles of Facial Expression: None, normal Lips and Perioral Area: None, normal Jaw: None, normal Tongue: None, normal,Extremity Movements Upper (arms, wrists, hands,  fingers): None, normal Lower (legs, knees, ankles, toes): None, normal, Trunk Movements Neck, shoulders, hips: None, normal, Overall Severity Severity of abnormal movements (highest score from questions above): None, normal Incapacitation due to abnormal movements: None, normal Patient's awareness of abnormal movements (rate only patient's report): No Awareness, Dental Status Current problems with teeth and/or dentures?: No Does patient usually wear dentures?: No  CIWA:  CIWA-Ar Total: 1 COWS:  COWS Total Score: 2  Musculoskeletal:   Psychiatric Specialty Exam:  See Dr. Avel SensorFagan's SRA for details  Have you used any form of tobacco in the last 30 days? (Cigarettes, Smokeless Tobacco, Cigars, and/or Pipes): Yes  Has this patient used any form of tobacco in the last 30 days? (Cigarettes, Smokeless Tobacco, Cigars, and/or Pipes) Yes, Yes, Combs prescription for an FDA-approved tobacco cessation medication was offered at discharge and the patient refused  Blood Alcohol level:  Lab Results  Component Value Date   ETH 236 (H) 08/17/2018   ETH 190 (H) 07/14/2018    Metabolic Disorder Labs:  Lab Results  Component Value Date   HGBA1C 5.2 08/19/2018  MPG 102.54 08/19/2018   No results found for: PROLACTIN Lab Results  Component Value Date   CHOL 113 08/19/2018   TRIG 90 08/19/2018   HDL 45 08/19/2018   CHOLHDL 2.5 08/19/2018   VLDL 18 08/19/2018   LDLCALC 50 08/19/2018   LDLCALC 50 07/15/2018    See Psychiatric Specialty Exam and Suicide Risk Assessment completed by Attending Physician prior to discharge.  Discharge destination:  Home  Is patient on multiple antipsychotic therapies at discharge:  No   Has Patient had three or more failed trials of antipsychotic monotherapy by history:  No  Recommended Plan for Multiple Antipsychotic Therapies: NA   Allergies as of 08/21/2018      Reactions   Penicillins       Medication List    TAKE these medications     Indication   acamprosate 333 MG tablet Commonly known as: CAMPRAL Take 2 tablets (666 mg total) by mouth 3 (three) times daily with meals.  Indication: Abuse or Misuse of Alcohol   FLUoxetine 20 MG capsule Commonly known as: PROZAC Take 1 capsule (20 mg total) by mouth daily. Start taking on: August 22, 2018  Indication: Depression      Follow-up [facility] Follow up on 08/21/2018.   Why: You have been accepted for treatment on Thursday, 7/30 at 2:00p. Please bring your photo ID, 30 day supply of medictions and money.  Contact information: [address] [place] [address] [phone]           Follow-up recommendations:   Continue with medications as prescribed Follow-up with outpatient AA  Comments:  Take all medications as prescribed. Keep all follow-up appointments as scheduled.  Do not consume alcohol or use illegal drugs while on prescription medications. Report any adverse effects from your medications to your primary care provider promptly.  In the event of recurrent symptoms or worsening symptoms, call 911, [facility] crisis hotline, or go to the nearest emergency department for evaluation.   Signed: Mallie Darting, NP 08/21/2018, 9:21 AM

## 2018-08-21 NOTE — Care Management (Signed)
Patient has been accepted to [facility] for Thursday, 7/30 at 2:00p.   CMA has notified CSW, [place].        Prima Rayner Care Management Assistant  Email:[email or web address] Office: [phone]  

## 2018-08-21 NOTE — Progress Notes (Signed)
D: Pt was at nurse's station upon initial approach.  Pt presents with anxious, depressed affect and mood.  He describes his day as "all right" and states "I'm still going through withdrawals."  Pt was worried that he may be discharged tomorrow and states "I'm not ready."  Pt denies SI/HI, denies pain.  He reports AH, stating "I still be getting my voices."  Pt has been visible in milieu.  He paced the hall frequently earlier this evening.  A: Introduced self to pt.  Met with pt 1:1.  Actively listened to pt and offered support and encouragement.  PRN medication administered for anxiety and sleep.  15 minute safety checks in place.  R: Pt is safe on the unit.  Pt is compliant with medications.  Pt verbally contracts for safety.  Will continue to monitor and assess.

## 2018-08-21 NOTE — [facility] Suicide Risk Assessment (Signed)
[facility] Discharge Suicide Risk Assessment   Principal Problem: Polysubstance abuse/alcohol intoxication/substance-induced mood disorder depressed type/self-destructive behaviors while intoxicated  discharge Diagnoses: Active Problems:   MDD (major depressive disorder)   Total Time spent with patient: 45 minutes  Musculoskeletal: Strength & Muscle Tone: within normal limits Gait & Station: normal Patient leans: N/A  Psychiatric Specialty Exam: ROS  Blood pressure 131/90, pulse 60, temperature 97.8 F (36.6 C), resp. rate 16, height 5\' 11"  (1.803 m), weight 85.3 kg.Body mass index is 26.22 kg/m.  General Appearance: Casual  Eye Contact::  Good  Speech:  Clear and Coherent409  Volume:  Decreased  Mood:  Euthymic  Affect:  Restricted  Thought Process:  Coherent and Descriptions of Associations: Circumstantial  Orientation:  Full (Time, Place, and Person)  Thought Content:  Logical  Suicidal Thoughts:  No  Homicidal Thoughts:  No  Memory:  Recent;   Fair  Judgement:  Fair  Insight:  Fair  Psychomotor Activity:  Normal  Concentration:  Fair  Recall:  [employer] of Knowledge:Fair  Language: Fair  Akathisia:  Negative  Handed:  Right  AIMS (if indicated):     Assets:  Communication Skills Leisure Time Physical Health Resilience Social Support  Sleep:  Number of Hours: 5.5  Cognition: WNL  ADL's:  Intact   Mental Status Per Nursing Assessment::   On Admission:  Suicidal ideation indicated by patient, Self-harm behaviors  Demographic Factors:  Male and Low socioeconomic status  Loss Factors: NA  Historical Factors: NA  Risk Reduction Factors:   Positive social support  Continued Clinical Symptoms:  Alcohol/Substance Abuse/Dependencies  Cognitive Features That Contribute To Risk:  None    Suicide Risk:  Minimal: No identifiable suicidal ideation.  Patients presenting with no risk factors but with morbid ruminations; may be classified as minimal risk based on  the severity of the depressive symptoms  Follow-up [facility] Follow up.   Specialty: Addiction Medicine Why: Referral made on 08/19/2018. Contact information: [place] [address] [place], [facility] Follow up.   Why: Referral made on 08/19/2018.  Contact information: [address] [place] [address] [phone]           In summary, patient's a risk to self is fully dependent upon his level of substance abuse, only accident self-destructive manner when intoxicated but he has refused Antabuse but will take Campral  Plan Of Care/Follow-up recommendations:  Activity:  full  Fallen Crisostomo, MD 08/21/2018, 8:59 AM

## 2018-08-21 NOTE — Progress Notes (Signed)
D:  Patient's self inventory sheet, patient has fair sleep, sleep medication helpful.  Fair appetite, normal energy level, good concentration.  Rated depression and anxiety 100, hopeless 9.  Withdrawals, chilling, cramping.  SI sometimes.  Physical problems, headaches.  Denied physical pain.  Patient did not trust himself, still withdrawing. A:  Medications administered per MD orders.  Emotional support and encouragement given patient. R:  Denied HI.  Denied A/V hallucinations.  Patient stated he was not SI at this time.  Now he knows he will be going for treatment and not going back in the same environment before admission to [facility].  Safety maintained with 15 minute checks. Patient looking forward to his treatment program.

## 2018-08-21 NOTE — Progress Notes (Signed)
Discharge Note:  Patient discharged with driver to treatment center.  Patient denied SI and HI.  Denied A/V hallucinations.  Suicide prevention information given and discussed with patient who stated he understood and had no questions.  Patient stated he received all his belongings, clothing, toiletries, misc items, etc.  Patient stated he appreciated all assistance received from [facility] staff.  All required discharge information given to patient at discharge.

## 2020-10-25 ENCOUNTER — Emergency Department (HOSPITAL_COMMUNITY): Payer: Self-pay

## 2020-10-25 ENCOUNTER — Encounter (HOSPITAL_COMMUNITY): Payer: Self-pay | Admitting: Emergency Medicine

## 2020-10-25 ENCOUNTER — Emergency Department (HOSPITAL_COMMUNITY)
Admission: EM | Admit: 2020-10-25 | Discharge: 2020-10-25 | Disposition: A | Discharge status: Home / Self Care | Payer: Self-pay | Attending: Emergency Medicine | Admitting: Emergency Medicine

## 2020-10-25 ENCOUNTER — Other Ambulatory Visit: Payer: Self-pay

## 2020-10-25 DIAGNOSIS — Z23 Encounter for immunization: Secondary | ICD-10-CM | POA: Insufficient documentation

## 2020-10-25 DIAGNOSIS — F17210 Nicotine dependence, cigarettes, uncomplicated: Secondary | ICD-10-CM | POA: Insufficient documentation

## 2020-10-25 DIAGNOSIS — R45851 Suicidal ideations: Secondary | ICD-10-CM | POA: Insufficient documentation

## 2020-10-25 DIAGNOSIS — F333 Major depressive disorder, recurrent, severe with psychotic symptoms: Secondary | ICD-10-CM | POA: Insufficient documentation

## 2020-10-25 DIAGNOSIS — W228XXA Striking against or struck by other objects, initial encounter: Secondary | ICD-10-CM | POA: Insufficient documentation

## 2020-10-25 DIAGNOSIS — S0011XA Contusion of right eyelid and periocular area, initial encounter: Secondary | ICD-10-CM | POA: Insufficient documentation

## 2020-10-25 DIAGNOSIS — Y905 Blood alcohol level of 100-119 mg/100 ml: Secondary | ICD-10-CM | POA: Insufficient documentation

## 2020-10-25 DIAGNOSIS — F1020 Alcohol dependence, uncomplicated: Secondary | ICD-10-CM | POA: Insufficient documentation

## 2020-10-25 DIAGNOSIS — R456 Violent behavior: Secondary | ICD-10-CM | POA: Insufficient documentation

## 2020-10-25 DIAGNOSIS — F1910 Other psychoactive substance abuse, uncomplicated: Secondary | ICD-10-CM

## 2020-10-25 LAB — CBC WITH DIFFERENTIAL/PLATELET
Abs Immature Granulocytes: 0.04 10*3/uL (ref 0.00–0.07)
Basophils Absolute: 0 10*3/uL (ref 0.0–0.1)
Basophils Relative: 1 %
Eosinophils Absolute: 0.1 10*3/uL (ref 0.0–0.5)
Eosinophils Relative: 1 %
HCT: 45 % (ref 39.0–52.0)
Hemoglobin: 15.2 g/dL (ref 13.0–17.0)
Immature Granulocytes: 1 %
Lymphocytes Relative: 29 %
Lymphs Abs: 1.9 10*3/uL (ref 0.7–4.0)
MCH: 30.9 pg (ref 26.0–34.0)
MCHC: 33.8 g/dL (ref 30.0–36.0)
MCV: 91.5 fL (ref 80.0–100.0)
Monocytes Absolute: 0.6 10*3/uL (ref 0.1–1.0)
Monocytes Relative: 9 %
Neutro Abs: 4.1 10*3/uL (ref 1.7–7.7)
Neutrophils Relative %: 59 %
Platelets: 156 10*3/uL (ref 150–400)
RBC: 4.92 MIL/uL (ref 4.22–5.81)
RDW: 14 % (ref 11.5–15.5)
WBC: 6.8 10*3/uL (ref 4.0–10.5)
nRBC: 0 % (ref 0.0–0.2)

## 2020-10-25 LAB — BASIC METABOLIC PANEL
Anion gap: 10 (ref 5–15)
BUN: 9 mg/dL (ref 6–20)
CO2: 25 mmol/L (ref 22–32)
Calcium: 8.9 mg/dL (ref 8.9–10.3)
Chloride: 106 mmol/L (ref 98–111)
Creatinine, Ser: 0.79 mg/dL (ref 0.61–1.24)
GFR, Estimated: >60 mL/min (ref >60)
Glucose, Bld: 110 mg/dL — ABNORMAL HIGH (ref 70–99)
Potassium: 3.6 mmol/L (ref 3.5–5.1)
Sodium: 141 mmol/L (ref 135–145)

## 2020-10-25 LAB — ETHANOL: Alcohol, Ethyl (B): 104 mg/dL — ABNORMAL HIGH (ref <10)

## 2020-10-25 LAB — ACETAMINOPHEN LEVEL: Acetaminophen (Tylenol), Serum: <10 ug/mL — ABNORMAL LOW (ref 10–30)

## 2020-10-25 LAB — SALICYLATE LEVEL: Salicylate Lvl: <7 mg/dL — ABNORMAL LOW (ref 7.0–30.0)

## 2020-10-25 MED ORDER — TETANUS-DIPHTH-ACELL PERTUSSIS 5-2.5-18.5 LF-MCG/0.5 IM SUSY
0.5000 mL | PREFILLED_SYRINGE | Freq: Once | INTRAMUSCULAR | Status: AC
  Administered 2020-10-25: 0.5 mL via INTRAMUSCULAR
  Filled 2020-10-25: qty 0.5

## 2020-10-25 NOTE — BH Assessment (Signed)
Comprehensive Clinical Assessment (CCA) Note  10/25/2020 [name] [id number]  Chief Complaint:  Chief Complaint  Patient presents with   V70.1   Visit Diagnosis:   F33.3 Major depressive disorder, Recurrent episode, With psychotic features F10.20 Alcohol use disorder, Severe  Flowsheet Row ED from 10/25/2020 in [facility] EMERGENCY DEPARTMENT Admission (Dis[facility]) [facility] Ad[facility] (Discharged) [facility]  C-SSRS RISK CATEGORY High Risk Moderate Risk No Risk      The patient demonstrates the following risk factors for suicide: Chronic risk factors for suicide include:  psychiatric disorder of major depressive disorder and previous self harm and substance use disorder. Acute risk factors for suicide include: unemployment, social withdrawal/isolation, and loss (financial, interpersonal, professional). Protective factors for this patient include: positive social support, positive therapeutic relationship, coping skills, and hope for the future. Considering these factors, the overall suicide risk at this point appears to be high. Patient is appropriate for outpatient follow up.  High Risk = 1.1 sitter  Disposition: Thomas Schick NP, recommends psychiatric cleared, contingent upon getting alcohol use treatment and resources.  Disposition Social Worker will assist with treatment plan.  Disposition discussed with Engineering geologist, via secure chat in Epic.  RN to discuss disposition with EDP.    Thomas Combs is a 30 years old male who presents voluntarily to [facility] and unaccompanied.  Pt reports he has a history of major depression and has been feeling depressed for past two weeks.  Pt reports SI, "I tried to beat my face up, with my fist".  Pt reports previous self injurious in the past.  Pt reports hearing voices, "the voices tell me to kill myself, end my life".  Pt reports experiencing the  following symptoms: isolation, irritable, hopelessness, guilt and worthlessness.  Pt denied HI and Paranoia.  Pt says he has been drinking alcohol daily and his last consumption was ten hours ago (6 pack of beer).  Pt reports that he smokes marijuana occassionally. Pt reports that he smokes cigarettes. The blood alcohol level is 104.  Pt identifies his primary stressors as homeless and financial problems.  Pt reports that he has been sleeping in the rain last couple of days.  Pt reports no support person.  Pt reports his father was diagnosis with schizophrenia disorder.  Pt reports family history of substance used.  Pt denies any history of abuse or trauma.  Pt denies any current legal problems.  Pt says he is not currently receiving weekly outpatient therapy; also, is not currently taking prescribed medication.  Pt reports one previous inpatient psychiatric hospitalization at a alcohol rehabilitation facility, "that place really help me with the alcohol for ten days", pt unable to specify date or place.  Pt is dressed in scrubs, alert,oriented x 4 with slurred speech and restless motor behavior.  Eye contact is avoided.  Pt mood is angry and affect depressed. Thought process is relevant.  Pt's insight is lacking and judgement is impaired.  There is no indication Pt is currently responding to internal stimuli or experiencing delusional thought content.  Pt was guarded throughout the assessment.  CCA Screening, Triage and Referral (STR)  Patient Reported Information How did you hear about [place]? Self  Referral name: No data recorded Referral phone number: No data recorded  Whom do you see for routine medical problems? No data recorded Practice/Facility Name: No data recorded Practice/Facility Phone Number: No data recorded Name of Contact: No data  recorded Contact Number: No data recorded Contact Fax Number: No data recorded Prescriber Name: No data recorded Prescriber Address (if known): No data  recorded  What Is the Reason for Your Visit/Call Today? SI,  How Long Has This Been Causing You Problems? <Week  What Do You Feel Would Help You the Most Today? Alcohol or Drug Use Treatment; Treatment for Depression or other mood problem   Have You Recently Been in Any Inpatient Treatment (Hospital/Detox/Crisis Center/28-Day Program)? No data recorded Name/Location of Program/Hospital:No data recorded How Long Were You There? No data recorded When Were You Discharged? No data recorded  Have You Ever Received Services From [facility] Before? No data recorded Who Do You See at [facility]? No data recorded  Have You Recently Had Any Thoughts About Hurting Yourself? Yes  Are You Planning to Commit Suicide/Harm Yourself At This time? No   Have you Recently Had Thoughts About Hurting Someone Thomas Combs? No  Explanation: No data recorded  Have You Used Any Alcohol or Drugs in the Past 24 Hours? Yes  How Long Ago Did You Use Drugs or Alcohol? No data recorded What Did You Use and How Much? Alcohol, 6 pack of beer   Do You Currently Have a Therapist/Psychiatrist? No  Name of Therapist/Psychiatrist: No data recorded  Have You Been Recently Discharged From Any Office Practice or Programs? No  Explanation of Discharge From Practice/Program: No data recorded    CCA Screening Triage Referral Assessment Type of Contact: Tele-Assessment  Is this Initial or Reassessment? Initial Assessment  Date Telepsych consult ordered in [facility]:  10/25/20  Time Telepsych consult ordered in [facility]:  No data recorded  Patient Reported Information Reviewed? No data recorded Patient Left Without Being Seen? No data recorded Reason for Not Completing Assessment: No data recorded  Collateral Involvement: No collateral involved.   Does Patient Have a Automotive engineer Guardian? No data recorded Name and Contact of Legal Guardian: No data recorded If Minor and Not Living with Parent(s), Who has  Custody? n/a  Is CPS involved or ever been involved? Never  Is APS involved or ever been involved? Never   Patient Determined To Be At Risk for Harm To Self or Others Based on Review of Patient Reported Information or Presenting Complaint? Yes, for Self-Harm  Method: No data recorded Availability of Means: No data recorded Intent: No data recorded Notification Required: No data recorded Additional Information for Danger to Others Potential: No data recorded Additional Comments for Danger to Others Potential: No data recorded Are There Guns or Other Weapons in Your Home? No data recorded Types of Guns/Weapons: No data recorded Are These Weapons Safely Secured?                            No data recorded Who Could Verify You Are Able To Have These Secured: No data recorded Do You Have any Outstanding Charges, Pending Court Dates, Parole/Probation? No data recorded Contacted To Inform of Risk of Harm To Self or Others: Other: Comment (No need to notify.)   Location of Assessment: [facility]   Does Patient Present under Involuntary Commitment? No  IVC Papers Initial File Date: No data recorded  [place] of Residence: [place]   Patient Currently Receiving the Following Services: Not Receiving Services   Determination of Need: Emergent (2 hours)   Options For Referral: Inpatient Hospitalization     CCA Biopsychosocial Intake/Chief Complaint:  No data recorded Current Symptoms/Problems: No  data recorded  Patient Reported Schizophrenia/Schizoaffective Diagnosis in Past: No   Strengths: Pt asking for help  Preferences: No data recorded Abilities: No data recorded  Type of Services Patient Feels are Needed: No data recorded  Initial Clinical Notes/Concerns: No data recorded  Mental Health Symptoms Depression:   Difficulty Concentrating; Fatigue; Hopelessness; Increase/decrease in appetite; Irritability; Worthlessness   Duration of Depressive symptoms:  Greater than  two weeks   Mania:   None   Anxiety:    Restlessness; Irritability   Psychosis:   Delusions   Duration of Psychotic symptoms:  Less than six months   Trauma:   None   Obsessions:   Disrupts routine/functioning; Poor insight; Recurrent & persistent thoughts/impulses/images   Compulsions:   "Driven" to perform behaviors/acts; Repeated behaviors/mental acts; Intended to reduce stress or prevent another outcome   Inattention:   None   Hyperactivity/Impulsivity:   None   Oppositional/Defiant Behaviors:   None   Emotional Irregularity:   Chronic feelings of emptiness; Frantic efforts to avoid abandonment; Recurrent suicidal behaviors/gestures/threats   Other Mood/Personality Symptoms:   depressed/irritable    Mental Status Exam Appearance and self-care  Stature:   Average   Weight:   Average weight   Clothing:   -- (Pt dressed in scrubs.)   Grooming:   Neglected   Cosmetic use:   None   Posture/gait:   Normal   Motor activity:   Agitated; Restless   Sensorium  Attention:   Confused; Persistent   Concentration:   Anxiety interferes   Orientation:   Object; Person; Place; Situation   Recall/memory:   Normal   Affect and Mood  Affect:   Anxious; Depressed; Negative   Mood:   Angry; Anxious; Hopeless; Worthless; Negative   Relating  Eye contact:   Avoided   Facial expression:   Anxious; Angry; Sad; Tense   Attitude toward examiner:   Guarded; Resistant   Thought and Language  Speech flow:  Articulation error; Slurred   Thought content:   Suspicious   Preoccupation:   Suicide   Hallucinations:   Auditory   Organization:  No data recorded  [employer] of Knowledge:   Fair   Intelligence:   Needs investigation   Abstraction:   Functional   Judgement:   Impaired   Reality Testing:   Variable   Insight:   Lacking; Gaps   Decision Making:   Impulsive   Social Functioning  Social Maturity:    Isolates   Social Judgement:   "Street Smart"; Victimized   Stress  Stressors:   Housing   Coping Ability:   Exhausted; Deficient supports   Skill Deficits:   Decision making; Communication; Self-control; Self-care   Supports:   Support needed     Religion: Religion/Spirituality Are You A Religious Person?:  (UTA) How Might This Affect Treatment?: UTA  Leisure/Recreation: Leisure / Recreation Do You Have Hobbies?: Yes Leisure and Hobbies: Therapist, music  Exercise/Diet: Exercise/Diet Do You Exercise?: No Have You Gained or Lost A Significant Amount of Weight in the Past Six Months?: No Do You Follow a Special Diet?: No Do You Have Any Trouble Sleeping?: Yes Explanation of Sleeping Difficulties: Pt reprots that he is homeless, have been sleeping in the rain; impacting his sleep pattern.   CCA Employment/Education Employment/Work Situation: Employment / Work Situation Employment Situation: Unemployed Patient's Job has Been Impacted by Current Illness: No Has Patient ever Been in Equities trader?: No Did You Receive Any Psychiatric Treatment/Services While in [employer]?: No  Education: Education Is Patient Currently Attending School?: No Last Grade Completed:  (UTA) Did You Attend College?: No Did You Have An Individualized Education Program (IIEP):  (UTA) Did You Have Any Difficulty At School?:  (UTA) Patient's Education Has Been Impacted by Current Illness:  (UTA)   CCA Family/Childhood History Family and Relationship History: Family history Does patient have children?: Yes How many children?: 1 How is patient's relationship with their children?: distance  Childhood History:  Childhood History By whom was/is the patient raised?: Father Did patient suffer any verbal/emotional/physical/sexual abuse as a child?: No Did patient suffer from severe childhood neglect?: No Has patient ever been sexually abused/assaulted/raped as an adolescent or adult?: No Was  the patient ever a victim of a crime or a disaster?: No Witnessed domestic violence?: No Has patient been affected by domestic violence as an adult?: No  Child/Adolescent Assessment:     CCA Substance Use Alcohol/Drug Use: Alcohol / Drug Use Pain Medications: see MAR Prescriptions: see MAR Over the Counter: see MAR History of alcohol / drug use?: Yes Longest period of sobriety (when/how long): Pt reports no long periods of sobriety, reports when shortterm sobriety for ten days, when he particpated in rehablitation center. Negative Consequences of Use: Financial, Legal Withdrawal Symptoms: Agitation, Aggressive/Assaultive Substance #1 Name of Substance 1: Alcohol 1 - Age of First Use: 15 1 - Amount (size/oz): 6 pack a day 1 - Frequency: daily 1 - Duration: ongoing 1 - Last Use / Amount: 10 hours ago 1 - Method of Aquiring: UTA 1- Route of Use: drinking Substance #2 Name of Substance 2: Marijuana 2 - Age of First Use: 15 2 - Amount (size/oz): UTA 2 - Frequency: occassions 2 - Duration: ongoing 2 - Last Use / Amount: UTA 2 - Method of Aquiring: UTA 2 - Route of Substance Use: smoking                     ASAM's:  Six Dimensions of Multidimensional Assessment  Dimension 1:  Acute Intoxication and/or Withdrawal Potential:   Dimension 1:  Description of individual's past and current experiences of substance use and withdrawal: Pt reports that he started drinking alcohol at 30 years old, "I will drink anything that I can get my hands on".  Dimension 2:  Biomedical Conditions and Complications:   Dimension 2:  Description of patient's biomedical conditions and  complications: Pt reports no biomedical conditions  Dimension 3:  Emotional, Behavioral, or Cognitive Conditions and Complications:  Dimension 3:  Description of emotional, behavioral, or cognitive conditions and complications: MDD  Dimension 4:  Readiness to Change:  Dimension 4:  Description of Readiness to  Change criteria: contemplation  Dimension 5:  Relapse, Continued use, or Continued Problem Potential:  Dimension 5:  Relapse, continued use, or continued problem potential critiera description: continued use  Dimension 6:  Recovery/Living Environment:  Dimension 6:  Recovery/Iiving environment criteria description: Pt reports that he is currently homeless  ASAM Severity Score: ASAM's Severity Rating Score: 13  ASAM Recommended Level of Treatment:     Substance use Disorder (SUD) Substance Use Disorder (SUD)  Checklist Symptoms of Substance Use: Continued use despite having a persistent/recurrent physical/psychological problem caused/exacerbated by use, Continued use despite persistent or recurrent social, interpersonal problems, caused or exacerbated by use, Evidence of tolerance, Evidence of withdrawal (Comment), Large amounts of time spent to obtain, use or recover from the substance(s), Persistent desire or unsuccessful efforts to cut down or control use, Presence of craving or  strong urge to use, Repeated use in physically hazardous situations, Substance(s) often taken in larger amounts or over longer times than was intended, Social, occupational, recreational activities given up or reduced due to use  Recommendations for Services/Supports/Treatments: Recommendations for Services/Supports/Treatments Recommendations For Services/Supports/Treatments: Residential-Level 2, SAIOP (Substance Abuse Intensive Outpatient Program)  DSM5 Diagnoses: Patient Active Problem List   Diagnosis Date Noted   MDD (major depressive disorder) 08/18/2018   Alcohol dependence with uncomplicated intoxication (HCC)    Severe recurrent major depression without psychotic features (HCC) 07/14/2018      Referrals to Alternative Service(s): Referred to Alternative Service(s):   Place:   Date:   Time:    Referred to Alternative Service(s):   Place:   Date:   Time:    Referred to Alternative Service(s):   Place:    Date:   Time:    Referred to Alternative Service(s):   Place:   Date:   Time:     Meryle Ready, Counselor

## 2020-10-25 NOTE — ED Provider Notes (Signed)
[facility] EMERGENCY DEPARTMENT Provider Note   CSN: [id number] Arrival date & time: 10/25/20  0502     History Chief Complaint  Patient presents with   V70.1    Thomas Combs is a 30 y.o. male.  HPI     This is a 30 year old male with a history of alcohol abuse major depressive disorder who presents with self harm behavior.  Patient reports that he hit himself in the right eye with his fists.  He states he was trying to hurt himself.  He states but "it just did not work."  He does report drinking alcohol earlier this evening.  Denies other drug use.  Denies other self-harm including mutilation or ingestion.  He denies hearing or seeing things that are not there.  He is only reporting right eye pain.  He reports some blurry vision.  No double vision.  Chart reviewed.  Recent history of aggressive behavior and multiple behavioral health visits for the same.  He has had suicidal ideation in the setting of substance abuse.  Past Medical History:  Diagnosis Date   Medical history non-contributory     Patient Active Problem List   Diagnosis Date Noted   MDD (major depressive disorder) 08/18/2018   Alcohol dependence with uncomplicated intoxication (HCC)    Severe recurrent major depression without psychotic features (HCC) 07/14/2018    History reviewed. No pertinent surgical history.     Family History  Problem Relation Age of Onset   Diabetes Mother     Social History   Tobacco Use   Smoking status: Every Day    Packs/day: 0.50    Types: Cigarettes   Smokeless tobacco: Never  Substance Use Topics   Alcohol use: Yes    Comment: pt will not answer   Drug use: No    Comment: pt will not answer    Home Medications Prior to Admission medications   Medication Sig Start Date End Date Taking? Authorizing Provider  acamprosate (CAMPRAL) 333 MG tablet Take 2 tablets (666 mg total) by mouth 3 (three) times daily with meals. 08/21/18   Malvin Johns, MD  FLUoxetine  (PROZAC) 20 MG capsule Take 1 capsule (20 mg total) by mouth daily. 08/22/18   Malvin Johns, MD    Allergies    Penicillins  Review of Systems   Review of Systems  Constitutional:  Negative for fever.  Eyes:  Positive for pain and visual disturbance. Negative for discharge.  Respiratory:  Negative for shortness of breath.   Cardiovascular:  Negative for chest pain.  Gastrointestinal:  Negative for abdominal pain, nausea and vomiting.  Genitourinary:  Negative for dysuria.  All other systems reviewed and are negative.  Physical Exam Updated Vital Signs BP 136/89   Pulse (!) 101   Temp 97.8 F (36.6 C) (Oral)   Resp 20   Ht 1.803 m (5\' 11" )   Wt 85 kg   SpO2 100%   BMI 26.14 kg/m   Physical Exam Vitals and nursing note reviewed.  Constitutional:      Appearance: He is well-developed. He is not ill-appearing.  HENT:     Head: Normocephalic.     Comments: Midface stable    Nose: Nose normal.     Mouth/Throat:     Mouth: Mucous membranes are moist.  Eyes:     Extraocular Movements: Extraocular movements intact.     Pupils: Pupils are equal, round, and reactive to light.     Comments: Ecchymosis about the right eye  with dried blood, slight swelling noted, no subconjunctival hematoma, extraocular movements intact, no significant tenderness to palpation about the orbit  Cardiovascular:     Rate and Rhythm: Normal rate and regular rhythm.     Heart sounds: Normal heart sounds. No murmur heard. Pulmonary:     Effort: Pulmonary effort is normal. No respiratory distress.     Breath sounds: Normal breath sounds. No wheezing.  Abdominal:     Palpations: Abdomen is soft.     Tenderness: There is no abdominal tenderness. There is no rebound.  Musculoskeletal:     Right lower leg: No edema.     Left lower leg: No edema.  Lymphadenopathy:     Cervical: No cervical adenopathy.  Skin:    General: Skin is warm and dry.  Neurological:     Mental Status: He is alert and oriented  to person, place, and time.  Psychiatric:     Comments: Withdrawn, flat    ED Results / Procedures / Treatments   Labs (all labs ordered are listed, but only abnormal results are displayed) Labs Reviewed  BASIC METABOLIC PANEL - Abnormal; Notable for the following components:      Result Value   Glucose, Bld 110 (*)    All other components within normal limits  ACETAMINOPHEN LEVEL - Abnormal; Notable for the following components:   Acetaminophen (Tylenol), Serum <10 (*)    All other components within normal limits  SALICYLATE LEVEL - Abnormal; Notable for the following components:   Salicylate Lvl <7.0 (*)    All other components within normal limits  ETHANOL - Abnormal; Notable for the following components:   Alcohol, Ethyl (B) 104 (*)    All other components within normal limits  CBC WITH DIFFERENTIAL/PLATELET  RAPID URINE DRUG SCREEN, HOSP PERFORMED    EKG None  Radiology CT Maxillofacial Wo Contrast  Result Date: 10/25/2020 CLINICAL DATA:  30 year old male status post blunt trauma to the right eye. EXAM: CT MAXILLOFACIAL WITHOUT CONTRAST TECHNIQUE: Multidetector CT imaging of the maxillofacial structures was performed. Multiplanar CT image reconstructions were also generated. COMPARISON:  None. FINDINGS: Osseous: Scattered posterior dental caries. Prominent periapical lucency anterior right mandible molar (series 9, image 26) and there is lateral mandible alveolar process dehiscence associated (series 3, image 61). No acute regional soft tissue inflammation. Mandible otherwise intact and normally located. Maxilla, zygoma, pterygoid and nasal bones appear intact. Visible central skull base and cervical vertebrae appear intact. Visible calvarium intact. Orbits: Intact orbital walls. Right side preseptal soft tissue thickening and swelling. Right globe remains intact. Bilateral intraorbital soft tissues appear normal. No soft tissue gas identified. Sinuses: Trace bilateral paranasal  sinus mucosal thickening, including in the bilateral maxillary alveolar recesses, anterior sphenoid sinuses. Tympanic cavities and mastoids are clear. Soft tissues: Small volume retained secretions in the nasopharynx. Negative visible noncontrast larynx, oropharynx, parapharyngeal spaces, retropharyngeal space, sublingual space, submandibular spaces, masticator spaces, and parotid spaces. No upper cervical lymphadenopathy. Limited intracranial: Negative. IMPRESSION: 1. Right side preseptal and periorbital soft tissue thickening and swelling. No underlying orbit fracture or other complicating features. 2. Posterior dental caries with chronic periapical lucency and dehiscence at the anterior right mandible molar. 3. Mild bilateral paranasal sinus inflammation. Electronically Signed   By: Odessa Fleming M.D.   On: 10/25/2020 06:51    Procedures Procedures   Medications Ordered in ED Medications  Tdap (BOOSTRIX) injection 0.5 mL (0.5 mLs Intramuscular Given 10/25/20 0636)    ED Course  I have reviewed the triage vital  signs and the nursing notes.  Pertinent labs & imaging results that were available during my care of the patient were reviewed by me and considered in my medical decision making (see chart for details).    MDM Rules/Calculators/A&P                           Patient presents with self harming behavior and intent.  He is nontoxic-appearing and vital signs are notable for a heart rate of 101.  He does have ecchymosis bruising about the right eye.  Tetanus was updated.  Labs sent and imaging obtained.  No evidence of facial fracture on CT face.  This was independently reviewed by myself.  Labs are notable for an alcohol level of 104.  Otherwise unremarkable.  Patient is medically cleared for TTS evaluation.  He is voluntary.  Final Clinical Impression(s) / ED Diagnoses Final diagnoses:  None    Rx / DC Orders ED Discharge Orders     None        Yukie Bergeron, Mayer Masker, MD 10/25/20  [phone]

## 2020-10-25 NOTE — ED Notes (Signed)
Pt changed in to burgundy scrubs per protocol. Belongings are labeled et locked in locker per protocol. (2 bags and a cell phone in one locker) Pt successfully wanded by security.

## 2020-10-25 NOTE — Discharge Instructions (Addendum)
Follow-up with [facility] this week ?

## 2020-10-25 NOTE — ED Triage Notes (Signed)
Pt states he swung on himself striking right eye. Pt states he wanted to kill himself but it did not work.

## 2021-12-04 ENCOUNTER — Encounter (HOSPITAL_COMMUNITY): Payer: Self-pay

## 2021-12-04 ENCOUNTER — Other Ambulatory Visit: Payer: Self-pay

## 2021-12-04 ENCOUNTER — Emergency Department (HOSPITAL_COMMUNITY)
Admission: EM | Admit: 2021-12-04 | Discharge: 2021-12-04 | Disposition: A | Discharge status: Home / Self Care | Payer: Self-pay | Attending: Emergency Medicine | Admitting: Emergency Medicine

## 2021-12-04 ENCOUNTER — Emergency Department (HOSPITAL_COMMUNITY): Payer: Self-pay

## 2021-12-04 DIAGNOSIS — K047 Periapical abscess without sinus: Secondary | ICD-10-CM | POA: Insufficient documentation

## 2021-12-04 DIAGNOSIS — K029 Dental caries, unspecified: Secondary | ICD-10-CM | POA: Insufficient documentation

## 2021-12-04 LAB — BASIC METABOLIC PANEL
Anion gap: 8 (ref 5–15)
BUN: 12 mg/dL (ref 6–20)
CO2: 22 mmol/L (ref 22–32)
Calcium: 8.7 mg/dL — ABNORMAL LOW (ref 8.9–10.3)
Chloride: 105 mmol/L (ref 98–111)
Creatinine, Ser: 1.01 mg/dL (ref 0.61–1.24)
GFR, Estimated: >60 mL/min (ref >60)
Glucose, Bld: 139 mg/dL — ABNORMAL HIGH (ref 70–99)
Potassium: 3.3 mmol/L — ABNORMAL LOW (ref 3.5–5.1)
Sodium: 135 mmol/L (ref 135–145)

## 2021-12-04 LAB — CBC WITH DIFFERENTIAL/PLATELET
Abs Immature Granulocytes: 0.06 10*3/uL (ref 0.00–0.07)
Basophils Absolute: 0 10*3/uL (ref 0.0–0.1)
Basophils Relative: 0 %
Eosinophils Absolute: 0.1 10*3/uL (ref 0.0–0.5)
Eosinophils Relative: 1 %
HCT: 48.2 % (ref 39.0–52.0)
Hemoglobin: 16.3 g/dL (ref 13.0–17.0)
Immature Granulocytes: 0 %
Lymphocytes Relative: 11 %
Lymphs Abs: 1.5 10*3/uL (ref 0.7–4.0)
MCH: 31 pg (ref 26.0–34.0)
MCHC: 33.8 g/dL (ref 30.0–36.0)
MCV: 91.8 fL (ref 80.0–100.0)
Monocytes Absolute: 1.4 10*3/uL — ABNORMAL HIGH (ref 0.1–1.0)
Monocytes Relative: 10 %
Neutro Abs: 10.8 10*3/uL — ABNORMAL HIGH (ref 1.7–7.7)
Neutrophils Relative %: 78 %
Platelets: 151 10*3/uL (ref 150–400)
RBC: 5.25 MIL/uL (ref 4.22–5.81)
RDW: 14.2 % (ref 11.5–15.5)
WBC: 13.8 10*3/uL — ABNORMAL HIGH (ref 4.0–10.5)
nRBC: 0 % (ref 0.0–0.2)

## 2021-12-04 MED ORDER — ONDANSETRON HCL 4 MG/2ML IJ SOLN
4.0000 mg | Freq: Once | INTRAMUSCULAR | Status: AC
  Administered 2021-12-04: 4 mg via INTRAVENOUS
  Filled 2021-12-04: qty 2

## 2021-12-04 MED ORDER — HYDROCODONE-ACETAMINOPHEN 5-325 MG PO TABS: 1.0000 | ORAL_TABLET | Freq: Four times a day (QID) | ORAL | 0 refills | Status: DC | PRN

## 2021-12-04 MED ORDER — SODIUM CHLORIDE 0.9 % IV SOLN
2.0000 g | INTRAVENOUS | Status: DC
  Administered 2021-12-04: 2 g via INTRAVENOUS
  Filled 2021-12-04: qty 20

## 2021-12-04 MED ORDER — MORPHINE SULFATE (PF) 4 MG/ML IV SOLN
4.0000 mg | Freq: Once | INTRAVENOUS | Status: AC
  Administered 2021-12-04: 4 mg via INTRAVENOUS
  Filled 2021-12-04: qty 1

## 2021-12-04 MED ORDER — KETOROLAC TROMETHAMINE 30 MG/ML IJ SOLN
30.0000 mg | Freq: Once | INTRAMUSCULAR | Status: AC
  Administered 2021-12-04: 30 mg via INTRAVENOUS
  Filled 2021-12-04: qty 1

## 2021-12-04 MED ORDER — CLINDAMYCIN HCL 150 MG PO CAPS: 300.0000 mg | ORAL_CAPSULE | Freq: Four times a day (QID) | ORAL | 0 refills | Status: DC

## 2021-12-04 MED ORDER — METRONIDAZOLE 500 MG/100ML IV SOLN
500.0000 mg | Freq: Two times a day (BID) | INTRAVENOUS | Status: DC
  Administered 2021-12-04: 500 mg via INTRAVENOUS
  Filled 2021-12-04: qty 100

## 2021-12-04 MED ORDER — IOHEXOL 300 MG/ML  SOLN
75.0000 mL | Freq: Once | INTRAMUSCULAR | Status: AC | PRN
  Administered 2021-12-04: 75 mL via INTRAVENOUS

## 2021-12-04 NOTE — ED Triage Notes (Signed)
Pt arrived from home via POV w c/o dental pain and swelling. Pt noted to have swelling to left side of face.8/10 on pain scale

## 2021-12-04 NOTE — ED Provider Notes (Signed)
[facility] EMERGENCY DEPARTMENT  Provider Note  CSN: [id number] Arrival date & time: 12/04/21 0439  History Chief Complaint  Patient presents with   Dental Pain   Oral Swelling    Thomas Combs is a 31 y.o. male reports 2-3 days of L lower dental pain where he has a cracked tooth. He woke up during the night with increased swelling to the left side of his face. Does not see a dentist regularly. No fevers or drainage. No difficulty swallowing or breathing.    Home Medications Prior to Admission medications   Medication Sig Start Date End Date Taking? Authorizing Provider  clindamycin (CLEOCIN) 150 MG capsule Take 2 capsules (300 mg total) by mouth 4 (four) times daily. 12/04/21  Yes Pollyann Savoy, MD  HYDROcodone-acetaminophen (NORCO/VICODIN) 5-325 MG tablet Take 1 tablet by mouth every 6 (six) hours as needed for severe pain. 12/04/21  Yes Pollyann Savoy, MD  acamprosate (CAMPRAL) 333 MG tablet Take 2 tablets (666 mg total) by mouth 3 (three) times daily with meals. 08/21/18   Malvin Johns, MD  FLUoxetine (PROZAC) 20 MG capsule Take 1 capsule (20 mg total) by mouth daily. 08/22/18   Malvin Johns, MD     Allergies    Penicillins   Review of Systems   Review of Systems Please see HPI for pertinent positives and negatives  Physical Exam BP (!) 136/91   Pulse 83   Temp 98.7 F (37.1 C) (Oral)   Resp 20   SpO2 98%   Physical Exam Vitals and nursing note reviewed.  Constitutional:      Appearance: Normal appearance.  HENT:     Head: Normocephalic and atraumatic.     Nose: Nose normal.     Mouth/Throat:     Mouth: Mucous membranes are moist.     Comments: Marked soft tissue induration and erythema to L lower face/jaw, tenderness to palpation to tooth #19 with large dental caries. No fluctuance Eyes:     Extraocular Movements: Extraocular movements intact.     Conjunctiva/sclera: Conjunctivae normal.  Cardiovascular:     Rate and Rhythm: Normal rate.   Pulmonary:     Effort: Pulmonary effort is normal.     Breath sounds: Normal breath sounds.  Abdominal:     General: Abdomen is flat.     Palpations: Abdomen is soft.     Tenderness: There is no abdominal tenderness.  Musculoskeletal:        General: No swelling. Normal range of motion.     Cervical back: Neck supple.  Skin:    General: Skin is warm and dry.  Neurological:     General: No focal deficit present.     Mental Status: He is alert.  Psychiatric:        Mood and Affect: Mood normal.     ED Results / Procedures / Treatments   EKG None  Procedures Procedures  Medications Ordered in the ED Medications  cefTRIAXone (ROCEPHIN) 2 g in sodium chloride 0.9 % 100 mL IVPB (2 g Intravenous New Bag/Given 12/04/21 0521)    And  metroNIDAZOLE (FLAGYL) IVPB 500 mg (500 mg Intravenous New Bag/Given 12/04/21 0522)  morphine (PF) 4 MG/ML injection 4 mg (4 mg Intravenous Given 12/04/21 0545)  ketorolac (TORADOL) 30 MG/ML injection 30 mg (30 mg Intravenous Given 12/04/21 0545)  ondansetron (ZOFRAN) injection 4 mg (4 mg Intravenous Given 12/04/21 0545)  iohexol (OMNIPAQUE) 300 MG/ML solution 75 mL (75 mLs Intravenous Contrast Given 12/04/21 0556)  Initial Impression and Plan  Patient here with signs of a significant odontogenic infection. No definite abscess by exam. No signs of deep tissue infection or airway compromise. Will check labs, begin IV Abx and send for CT. He reports a childhood PCN allergy but does not know what reaction he had.   ED Course   Clinical Course as of 12/04/21 0630  Mon Dec 04, 2021  0600 CBC with leukocytosis. BMP with mild hyperglycemia [CS]  0628 I personally viewed the images from radiology studies and agree with radiologist interpretation: CT with shows mostly cellulitis, small abscess not amenable to drainage. Plan discharge with Abx, pain meds and outpatient oral surgery follow up.   [CS]    Clinical Course User Index [CS] Pollyann Savoy, MD     MDM Rules/Calculators/A&P Medical Decision Making Given presenting complaint, I considered that admission might be necessary. After review of results from ED lab and/or imaging studies, admission to the hospital is not indicated at this time.    Problems Addressed: Dental infection: acute illness or injury  Amount and/or Complexity of Data Reviewed Labs: ordered. Decision-making details documented in ED Course. Radiology: ordered and independent interpretation performed. Decision-making details documented in ED Course.  Risk Prescription drug management. Parenteral controlled substances. Decision regarding hospitalization.    Final Clinical Impression(s) / ED Diagnoses Final diagnoses:  Dental infection    Rx / DC Orders ED Discharge Orders          Ordered    clindamycin (CLEOCIN) 150 MG capsule  4 times daily        12/04/21 0630    HYDROcodone-acetaminophen (NORCO/VICODIN) 5-325 MG tablet  Every 6 hours PRN        12/04/21 0630             Pollyann Savoy, MD 12/04/21 0630

## 2022-06-26 ENCOUNTER — Emergency Department (HOSPITAL_COMMUNITY)
Admission: EM | Admit: 2022-06-26 | Discharge: 2022-06-26 | Payer: Self-pay | Attending: Emergency Medicine | Admitting: Emergency Medicine

## 2022-06-26 ENCOUNTER — Emergency Department (HOSPITAL_COMMUNITY): Payer: Self-pay

## 2022-06-26 ENCOUNTER — Other Ambulatory Visit: Payer: Self-pay

## 2022-06-26 ENCOUNTER — Encounter (HOSPITAL_COMMUNITY): Payer: Self-pay | Admitting: *Deleted

## 2022-06-26 DIAGNOSIS — S42001A Fracture of unspecified part of right clavicle, initial encounter for closed fracture: Secondary | ICD-10-CM | POA: Insufficient documentation

## 2022-06-26 DIAGNOSIS — F10920 Alcohol use, unspecified with intoxication, uncomplicated: Secondary | ICD-10-CM

## 2022-06-26 DIAGNOSIS — E876 Hypokalemia: Secondary | ICD-10-CM

## 2022-06-26 DIAGNOSIS — S42031A Displaced fracture of lateral end of right clavicle, initial encounter for closed fracture: Secondary | ICD-10-CM

## 2022-06-26 HISTORY — DX: Schizophrenia, unspecified: F20.9

## 2022-06-26 LAB — CBC WITH DIFFERENTIAL/PLATELET
Abs Immature Granulocytes: 0.02 10*3/uL (ref 0.00–0.07)
Basophils Absolute: 0.1 10*3/uL (ref 0.0–0.1)
Basophils Relative: 1 %
Eosinophils Absolute: 0.1 10*3/uL (ref 0.0–0.5)
Eosinophils Relative: 1 %
HCT: 49.4 % (ref 39.0–52.0)
Hemoglobin: 16.8 g/dL (ref 13.0–17.0)
Immature Granulocytes: 0 %
Lymphocytes Relative: 19 %
Lymphs Abs: 1.4 10*3/uL (ref 0.7–4.0)
MCH: 31.8 pg (ref 26.0–34.0)
MCHC: 34 g/dL (ref 30.0–36.0)
MCV: 93.6 fL (ref 80.0–100.0)
Monocytes Absolute: 0.6 10*3/uL (ref 0.1–1.0)
Monocytes Relative: 8 %
Neutro Abs: 5.1 10*3/uL (ref 1.7–7.7)
Neutrophils Relative %: 71 %
Platelets: 164 10*3/uL (ref 150–400)
RBC: 5.28 MIL/uL (ref 4.22–5.81)
RDW: 13.9 % (ref 11.5–15.5)
WBC: 7.2 10*3/uL (ref 4.0–10.5)
nRBC: 0 % (ref 0.0–0.2)

## 2022-06-26 LAB — RAPID URINE DRUG SCREEN, HOSP PERFORMED
Amphetamines: NOT DETECTED
Barbiturates: NOT DETECTED
Benzodiazepines: NOT DETECTED
Cocaine: NOT DETECTED
Opiates: NOT DETECTED
Tetrahydrocannabinol: POSITIVE — AB

## 2022-06-26 LAB — COMPREHENSIVE METABOLIC PANEL
ALT: 172 U/L — ABNORMAL HIGH (ref 0–44)
AST: 208 U/L — ABNORMAL HIGH (ref 15–41)
Albumin: 4.6 g/dL (ref 3.5–5.0)
Alkaline Phosphatase: 77 U/L (ref 38–126)
Anion gap: 19 — ABNORMAL HIGH (ref 5–15)
BUN: 7 mg/dL (ref 6–20)
CO2: 16 mmol/L — ABNORMAL LOW (ref 22–32)
Calcium: 8.9 mg/dL (ref 8.9–10.3)
Chloride: 104 mmol/L (ref 98–111)
Creatinine, Ser: 1.12 mg/dL (ref 0.61–1.24)
GFR, Estimated: >60 mL/min (ref >60)
Glucose, Bld: 118 mg/dL — ABNORMAL HIGH (ref 70–99)
Potassium: 3.1 mmol/L — ABNORMAL LOW (ref 3.5–5.1)
Sodium: 139 mmol/L (ref 135–145)
Total Bilirubin: 0.7 mg/dL (ref 0.3–1.2)
Total Protein: 8 g/dL (ref 6.5–8.1)

## 2022-06-26 LAB — ETHANOL: Alcohol, Ethyl (B): 279 mg/dL — ABNORMAL HIGH (ref <10)

## 2022-06-26 MED ORDER — POTASSIUM CHLORIDE CRYS ER 20 MEQ PO TBCR
40.0000 meq | EXTENDED_RELEASE_TABLET | Freq: Once | ORAL | Status: AC
  Administered 2022-06-26: 40 meq via ORAL
  Filled 2022-06-26: qty 2

## 2022-06-26 MED ORDER — ZIPRASIDONE MESYLATE 20 MG IM SOLR
20.0000 mg | Freq: Once | INTRAMUSCULAR | Status: AC
  Administered 2022-06-26: 20 mg via INTRAMUSCULAR
  Filled 2022-06-26: qty 20

## 2022-06-26 MED ORDER — LACTATED RINGERS IV BOLUS
1000.0000 mL | Freq: Once | INTRAVENOUS | Status: AC
  Administered 2022-06-26: 1000 mL via INTRAVENOUS

## 2022-06-26 NOTE — ED Notes (Signed)
Pt given ice chips
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
## 2022-06-26 NOTE — ED Notes (Signed)
Pt given urinal.
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
## 2022-06-26 NOTE — ED Notes (Signed)
ED Provider at bedside. 
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
## 2022-07-18 ENCOUNTER — Ambulatory Visit (INDEPENDENT_AMBULATORY_CARE_PROVIDER_SITE_OTHER): Payer: Self-pay | Admitting: Orthopedic Surgery

## 2022-07-18 ENCOUNTER — Encounter: Payer: Self-pay | Admitting: Orthopedic Surgery

## 2022-07-18 ENCOUNTER — Other Ambulatory Visit (INDEPENDENT_AMBULATORY_CARE_PROVIDER_SITE_OTHER): Payer: Self-pay

## 2022-07-18 DIAGNOSIS — S42001A Fracture of unspecified part of right clavicle, initial encounter for closed fracture: Secondary | ICD-10-CM

## 2022-07-18 DIAGNOSIS — S42031A Displaced fracture of lateral end of right clavicle, initial encounter for closed fracture: Secondary | ICD-10-CM
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
## 2022-07-18 NOTE — Patient Instructions (Signed)
Will plan to proceed with surgery on July 30, 2022.  Surgery will be at [facility].  The hospital will contact you (or the facility) to schedule a preoperative appointment, and to let you know what time you need to be at the hospital.  Surgery will be same-day.  Nothing eat or drink overnight.  Will see you back in clinic approximately 2 weeks after surgery
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
## 2022-07-27 ENCOUNTER — Encounter (HOSPITAL_COMMUNITY): Payer: Self-pay

## 2022-07-27 ENCOUNTER — Other Ambulatory Visit: Payer: Self-pay

## 2022-07-27 ENCOUNTER — Encounter (HOSPITAL_COMMUNITY)
Admission: RE | Admit: 2022-07-27 | Discharge: 2022-07-27 | Disposition: A | Discharge status: Home / Self Care | Payer: Self-pay | Source: Ambulatory Visit | Attending: Orthopedic Surgery | Admitting: Orthopedic Surgery

## 2022-07-27 VITALS — Ht 71.0 in | Wt 195.0 lb

## 2022-07-27 DIAGNOSIS — F10220 Alcohol dependence with intoxication, uncomplicated: Secondary | ICD-10-CM

## 2022-07-27 NOTE — Pre-Procedure Instructions (Signed)
Pre-op phone call done. Patient was incarcerated and just released and could not come for blood work or ERAS today. Dr Lamount Cranker. Will do blood work on arrival 07/30/2022.

## 2022-07-30 ENCOUNTER — Ambulatory Visit (HOSPITAL_COMMUNITY): Payer: Self-pay | Admitting: Anesthesiology

## 2022-07-30 ENCOUNTER — Other Ambulatory Visit: Payer: Self-pay

## 2022-07-30 ENCOUNTER — Encounter (HOSPITAL_COMMUNITY)
Admission: RE | Disposition: A | Discharge status: Home / Self Care | Payer: Self-pay | Source: Home / Self Care | Attending: Orthopedic Surgery

## 2022-07-30 ENCOUNTER — Ambulatory Visit (HOSPITAL_COMMUNITY)
Admission: RE | Admit: 2022-07-30 | Discharge: 2022-07-30 | Disposition: A | Discharge status: Home / Self Care | Payer: Self-pay | Attending: Orthopedic Surgery | Admitting: Orthopedic Surgery

## 2022-07-30 ENCOUNTER — Encounter (HOSPITAL_COMMUNITY): Payer: Self-pay | Admitting: Orthopedic Surgery

## 2022-07-30 ENCOUNTER — Ambulatory Visit (HOSPITAL_COMMUNITY): Payer: Self-pay

## 2022-07-30 ENCOUNTER — Ambulatory Visit (HOSPITAL_BASED_OUTPATIENT_CLINIC_OR_DEPARTMENT_OTHER): Payer: Self-pay | Admitting: Anesthesiology

## 2022-07-30 DIAGNOSIS — G709 Myoneural disorder, unspecified: Secondary | ICD-10-CM | POA: Insufficient documentation

## 2022-07-30 DIAGNOSIS — S42001D Fracture of unspecified part of right clavicle, subsequent encounter for fracture with routine healing: Secondary | ICD-10-CM

## 2022-07-30 DIAGNOSIS — F17210 Nicotine dependence, cigarettes, uncomplicated: Secondary | ICD-10-CM | POA: Insufficient documentation

## 2022-07-30 DIAGNOSIS — Z79899 Other long term (current) drug therapy: Secondary | ICD-10-CM | POA: Insufficient documentation

## 2022-07-30 DIAGNOSIS — S42001A Fracture of unspecified part of right clavicle, initial encounter for closed fracture: Secondary | ICD-10-CM

## 2022-07-30 DIAGNOSIS — F32A Depression, unspecified: Secondary | ICD-10-CM | POA: Insufficient documentation

## 2022-07-30 DIAGNOSIS — F10220 Alcohol dependence with intoxication, uncomplicated: Secondary | ICD-10-CM

## 2022-07-30 DIAGNOSIS — X58XXXA Exposure to other specified factors, initial encounter: Secondary | ICD-10-CM | POA: Insufficient documentation

## 2022-07-30 DIAGNOSIS — S42031A Displaced fracture of lateral end of right clavicle, initial encounter for closed fracture: Secondary | ICD-10-CM

## 2022-07-30 HISTORY — PX: ORIF CLAVICULAR FRACTURE: SHX5055

## 2022-07-30 LAB — COMPREHENSIVE METABOLIC PANEL
ALT: 31 U/L (ref 0–44)
AST: 25 U/L (ref 15–41)
Albumin: 3.9 g/dL (ref 3.5–5.0)
Alkaline Phosphatase: 73 U/L (ref 38–126)
Anion gap: 9 (ref 5–15)
BUN: 13 mg/dL (ref 6–20)
CO2: 22 mmol/L (ref 22–32)
Calcium: 8.5 mg/dL — ABNORMAL LOW (ref 8.9–10.3)
Chloride: 107 mmol/L (ref 98–111)
Creatinine, Ser: 0.89 mg/dL (ref 0.61–1.24)
GFR, Estimated: >60 mL/min (ref >60)
Glucose, Bld: 101 mg/dL — ABNORMAL HIGH (ref 70–99)
Potassium: 3.8 mmol/L (ref 3.5–5.1)
Sodium: 138 mmol/L (ref 135–145)
Total Bilirubin: 0.3 mg/dL (ref 0.3–1.2)
Total Protein: 7 g/dL (ref 6.5–8.1)

## 2022-07-30 LAB — CBC
HCT: 46.7 % (ref 39.0–52.0)
Hemoglobin: 16 g/dL (ref 13.0–17.0)
MCH: 31.4 pg (ref 26.0–34.0)
MCHC: 34.3 g/dL (ref 30.0–36.0)
MCV: 91.7 fL (ref 80.0–100.0)
Platelets: 172 10*3/uL (ref 150–400)
RBC: 5.09 MIL/uL (ref 4.22–5.81)
RDW: 13.2 % (ref 11.5–15.5)
WBC: 9 10*3/uL (ref 4.0–10.5)
nRBC: 0 % (ref 0.0–0.2)

## 2022-07-30 SURGERY — OPEN REDUCTION INTERNAL FIXATION (ORIF) CLAVICULAR FRACTURE
Anesthesia: General | Site: Chest | Laterality: Right

## 2022-07-30 MED ORDER — LIDOCAINE HCL (CARDIAC) PF 100 MG/5ML IV SOSY
PREFILLED_SYRINGE | INTRAVENOUS | Status: DC | PRN
  Administered 2022-07-30: 100 mg via INTRAVENOUS

## 2022-07-30 MED ORDER — CHLORHEXIDINE GLUCONATE 0.12 % MT SOLN
15.0000 mL | Freq: Once | OROMUCOSAL | Status: AC
  Administered 2022-07-30: 15 mL via OROMUCOSAL

## 2022-07-30 MED ORDER — OXYCODONE HCL 5 MG PO TABS: 5.0000 mg | ORAL_TABLET | ORAL | 0 refills | Status: AC | PRN

## 2022-07-30 MED ORDER — PHENYLEPHRINE HCL (PRESSORS) 10 MG/ML IV SOLN
INTRAVENOUS | Status: DC | PRN
  Administered 2022-07-30 (×2): 160 ug via INTRAVENOUS

## 2022-07-30 MED ORDER — DEXAMETHASONE SODIUM PHOSPHATE 10 MG/ML IJ SOLN
INTRAMUSCULAR | Status: AC
  Filled 2022-07-30: qty 1

## 2022-07-30 MED ORDER — LACTATED RINGERS IV SOLN: INTRAVENOUS | Status: DC

## 2022-07-30 MED ORDER — ONDANSETRON HCL 4 MG/2ML IJ SOLN
4.0000 mg | Freq: Once | INTRAMUSCULAR | Status: AC | PRN
  Administered 2022-07-30: 4 mg via INTRAVENOUS
  Filled 2022-07-30: qty 2

## 2022-07-30 MED ORDER — ROCURONIUM BROMIDE 100 MG/10ML IV SOLN
INTRAVENOUS | Status: DC | PRN
  Administered 2022-07-30: 60 mg via INTRAVENOUS

## 2022-07-30 MED ORDER — ONDANSETRON HCL 4 MG/2ML IJ SOLN
INTRAMUSCULAR | Status: AC
  Filled 2022-07-30: qty 2

## 2022-07-30 MED ORDER — BUPIVACAINE HCL (PF) 0.5 % IJ SOLN
INTRAMUSCULAR | Status: AC
  Filled 2022-07-30: qty 30

## 2022-07-30 MED ORDER — MELOXICAM 15 MG PO TABS: 15.0000 mg | ORAL_TABLET | Freq: Every day | ORAL | 0 refills | Status: AC

## 2022-07-30 MED ORDER — LIDOCAINE HCL (PF) 2 % IJ SOLN
INTRAMUSCULAR | Status: AC
  Filled 2022-07-30: qty 5

## 2022-07-30 MED ORDER — BUPIVACAINE HCL 0.5 % IJ SOLN
INTRAMUSCULAR | Status: DC | PRN
  Administered 2022-07-30: 30 mL

## 2022-07-30 MED ORDER — SUGAMMADEX SODIUM 500 MG/5ML IV SOLN
INTRAVENOUS | Status: DC | PRN
  Administered 2022-07-30: 200 mg via INTRAVENOUS

## 2022-07-30 MED ORDER — HYDROMORPHONE HCL 1 MG/ML IJ SOLN: 0.2500 mg | INTRAMUSCULAR | Status: DC | PRN

## 2022-07-30 MED ORDER — FENTANYL CITRATE (PF) 100 MCG/2ML IJ SOLN
INTRAMUSCULAR | Status: DC | PRN
  Administered 2022-07-30 (×2): 100 ug via INTRAVENOUS

## 2022-07-30 MED ORDER — DEXAMETHASONE SODIUM PHOSPHATE 10 MG/ML IJ SOLN
INTRAMUSCULAR | Status: DC | PRN
  Administered 2022-07-30: 10 mg via INTRAVENOUS

## 2022-07-30 MED ORDER — PROPOFOL 10 MG/ML IV BOLUS
INTRAVENOUS | Status: DC | PRN
  Administered 2022-07-30: 200 mg via INTRAVENOUS

## 2022-07-30 MED ORDER — PROPOFOL 10 MG/ML IV BOLUS
INTRAVENOUS | Status: AC
  Filled 2022-07-30: qty 20

## 2022-07-30 MED ORDER — SODIUM CHLORIDE 0.9 % IR SOLN
Status: DC | PRN
  Administered 2022-07-30: 1000 mL

## 2022-07-30 MED ORDER — MEPERIDINE HCL 50 MG/ML IJ SOLN: 6.2500 mg | INTRAMUSCULAR | Status: DC | PRN

## 2022-07-30 MED ORDER — ROCURONIUM BROMIDE 10 MG/ML (PF) SYRINGE
PREFILLED_SYRINGE | INTRAVENOUS | Status: AC
  Filled 2022-07-30: qty 20

## 2022-07-30 MED ORDER — ORAL CARE MOUTH RINSE: 15.0000 mL | Freq: Once | OROMUCOSAL | Status: AC

## 2022-07-30 MED ORDER — ONDANSETRON HCL 4 MG/2ML IJ SOLN
INTRAMUSCULAR | Status: DC | PRN
  Administered 2022-07-30: 4 mg via INTRAVENOUS

## 2022-07-30 MED ORDER — MIDAZOLAM HCL 5 MG/5ML IJ SOLN
INTRAMUSCULAR | Status: DC | PRN
  Administered 2022-07-30: 2 mg via INTRAVENOUS

## 2022-07-30 MED ORDER — ACETAMINOPHEN 500 MG PO TABS: 1000.0000 mg | ORAL_TABLET | Freq: Three times a day (TID) | ORAL | 0 refills | Status: AC

## 2022-07-30 MED ORDER — ONDANSETRON HCL 4 MG PO TABS: 4.0000 mg | ORAL_TABLET | Freq: Three times a day (TID) | ORAL | 0 refills | Status: AC | PRN

## 2022-07-30 MED ORDER — FENTANYL CITRATE (PF) 250 MCG/5ML IJ SOLN
INTRAMUSCULAR | Status: AC
  Filled 2022-07-30: qty 5

## 2022-07-30 MED ORDER — MIDAZOLAM HCL 2 MG/2ML IJ SOLN
2.0000 mg | Freq: Once | INTRAMUSCULAR | Status: AC
  Administered 2022-07-30: 2 mg via INTRAVENOUS
  Filled 2022-07-30: qty 2

## 2022-07-30 MED ORDER — MIDAZOLAM HCL 2 MG/2ML IJ SOLN
INTRAMUSCULAR | Status: AC
  Filled 2022-07-30: qty 2

## 2022-07-30 MED ORDER — CEFAZOLIN SODIUM-DEXTROSE 2-4 GM/100ML-% IV SOLN
2.0000 g | INTRAVENOUS | Status: AC
  Administered 2022-07-30: 2 g via INTRAVENOUS
  Filled 2022-07-30: qty 100

## 2022-07-30 SURGICAL SUPPLY — 67 items
APL PRP STRL LF DISP 70% ISPRP (MISCELLANEOUS) ×2
BIT DRILL 2 CANN GRADUATED (BIT) IMPLANT
BIT DRILL 2.5 CANN ENDOSCOPIC (BIT) IMPLANT
BIT DRILL 2.7 (BIT) ×1
BIT DRILL 2.7X2.7/3XSCR ANKL (BIT) IMPLANT
BIT DRL 2.7X2.7/3XSCR ANKL (BIT) ×1
BLADE HEX COATED 2.75 (ELECTRODE) ×1 IMPLANT
BLADE SAW SGTL 83.5X18.5 (BLADE) ×1 IMPLANT
BLADE SURG 15 STRL LF DISP TIS (BLADE) IMPLANT
BLADE SURG 15 STRL SS (BLADE) ×2
BLADE SURG SZ10 CARB STEEL (BLADE) ×2 IMPLANT
BNDG GAUZE ELAST 4 BULKY (GAUZE/BANDAGES/DRESSINGS) ×1 IMPLANT
BUTTON DIST CLAVICLE PLATE TR (Anchor) IMPLANT
CHLORAPREP W/TINT 26 (MISCELLANEOUS) ×2 IMPLANT
CLOTH BEACON ORANGE TIMEOUT ST (SAFETY) ×1 IMPLANT
COVER LIGHT HANDLE STERIS (MISCELLANEOUS) ×2 IMPLANT
DRAPE C-ARM FOLDED MOBILE STRL (DRAPES) ×1 IMPLANT
DRAPE HALF SHEET 40X57 (DRAPES) ×1 IMPLANT
DRAPE INCISE IOBAN 44X35 STRL (DRAPES) ×1 IMPLANT
DRAPE ORTHO 2.5IN SPLIT 77X108 (DRAPES) ×2 IMPLANT
DRAPE ORTHO SPLIT 77X108 STRL (DRAPES) ×2
DRAPE SHOULDER BEACH CHAIR (DRAPES) ×1 IMPLANT
DRAPE U-SHAPE 47X51 STRL (DRAPES) ×1 IMPLANT
DRESSING AQUACEL AG ADV 3.5X12 (MISCELLANEOUS) ×1 IMPLANT
DRSG AQUACEL AG ADV 3.5X10 (GAUZE/BANDAGES/DRESSINGS) IMPLANT
DRSG AQUACEL AG ADV 3.5X12 (MISCELLANEOUS) ×1
ELECT REM PT RETURN 9FT ADLT (ELECTROSURGICAL) ×1
ELECTRODE REM PT RTRN 9FT ADLT (ELECTROSURGICAL) ×1 IMPLANT
GLOVE BIO SURGEON STRL SZ8 (GLOVE) ×1 IMPLANT
GLOVE BIOGEL PI IND STRL 7.0 (GLOVE) ×2 IMPLANT
GLOVE SRG 8 PF TXTR STRL LF DI (GLOVE) ×1 IMPLANT
GLOVE SURG UNDER POLY LF SZ8 (GLOVE) ×1
GOWN STRL REUS W/ TWL XL LVL3 (GOWN DISPOSABLE) ×1 IMPLANT
GOWN STRL REUS W/TWL LRG LVL3 (GOWN DISPOSABLE) ×2 IMPLANT
GOWN STRL REUS W/TWL XL LVL3 (GOWN DISPOSABLE) ×1
K-WIRE BB-TAK (WIRE) ×2
KIT BLADEGUARD II DBL (SET/KITS/TRAYS/PACK) ×2 IMPLANT
KIT TURNOVER KIT A (KITS) ×1 IMPLANT
KWIRE BB-TAK (WIRE) IMPLANT
MANIFOLD NEPTUNE II (INSTRUMENTS) ×1 IMPLANT
MARKER SKIN DUAL TIP RULER LAB (MISCELLANEOUS) ×1 IMPLANT
NDL HYPO 21X1.5 SAFETY (NEEDLE) IMPLANT
NEEDLE HYPO 21X1.5 SAFETY (NEEDLE) ×1 IMPLANT
NS IRRIG 1000ML POUR BTL (IV SOLUTION) ×1 IMPLANT
PACK TOTAL JOINT (CUSTOM PROCEDURE TRAY) ×1 IMPLANT
PIN DRILL 3.7MM KNTLS DIST (PIN) IMPLANT
PLATE DISTAL CLAVICLE SH RT SS (Plate) IMPLANT
POSITIONER HEAD 8X9X4 ADT (SOFTGOODS) ×1 IMPLANT
SCREW COMP KREULOCK 2.7X12 (Screw) IMPLANT
SCREW COMP KREULOCK 2.7X14 (Screw) IMPLANT
SCREW COMP KREULOCK 2.7X16 (Screw) IMPLANT
SCREW CORTEX LP TM SS 2.7X16 (Screw) IMPLANT
SCREW CORTICAL 2.7X18 LO PRO (Screw) IMPLANT
SCREW LOW PROFILE 3.5X16 (Screw) IMPLANT
SET BASIN LINEN APH (SET/KITS/TRAYS/PACK) ×1 IMPLANT
SLING ARM FOAM STRAP LRG (SOFTGOODS) IMPLANT
SPONGE T-LAP 18X18 ~~LOC~~+RFID (SPONGE) ×2 IMPLANT
STRIP CLOSURE SKIN 1/2X4 (GAUZE/BANDAGES/DRESSINGS) ×2 IMPLANT
SUT MNCRL AB 4-0 PS2 18 (SUTURE) ×1 IMPLANT
SUT MON AB 2-0 CT1 36 (SUTURE) ×1 IMPLANT
SUT VIC AB 2-0 CT1 27 (SUTURE) ×1
SUT VIC AB 2-0 CT1 TAPERPNT 27 (SUTURE) ×1 IMPLANT
SYR 30ML LL (SYRINGE) IMPLANT
SYR BULB IRRIG 60ML STRL (SYRINGE) ×1 IMPLANT
TOWEL OR 17X26 4PK STRL BLUE (TOWEL DISPOSABLE) ×1 IMPLANT
WATER STERILE IRR 1000ML POUR (IV SOLUTION) ×1 IMPLANT
YANKAUER SUCT 12FT TUBE ARGYLE (SUCTIONS) ×1 IMPLANT

## 2022-07-30 NOTE — Discharge Instructions (Signed)
Thomas Combs A. Thomas Leth, MD MS [facility] [address] [place],  [place]  [address] Phone: [phone] Fax: [phone]    POST-OPERATIVE INSTRUCTIONS - CLAVICLE FRACTURE   WOUND CARE You may leave the operative dressing in place until your follow-up appointment. KEEP THE INCISIONS CLEAN AND DRY. There may be a small amount of fluid/bleeding leaking at the surgical site. This is normal after surgery.  If it fills with liquid or blood please call us immediately to change it for you.   SHOWERING: - You may shower on Post-Op Day #3.  - The dressing is water resistant but do not scrub it as it may start to peel up.   - You may remove the sling for showering, but keep a water resistant pillow under the arm to keep both the  elbow and shoulder away from the body (mimicking the abduction sling).  - Gently pat the area dry.  - Do not soak the shoulder in water. Do not go swimming in the pool or ocean until your sutures are removed. - KEEP THE INCISIONS CLEAN AND DRY.  EXERCISES Wear the sling at all times except when doing your exercises. You may remove the sling for showering, but keep the arm across the chest or in a secondary sling.    Accidental/Purposeful External Rotation and shoulder flexion (reaching behind you) is to be avoided at all costs for the first month. It is ok to come out of your sling if your are sitting and have assistance for eating.  Do not lift anything heavier than 1 pound until we discuss it further in clinic. Please perform the exercises:   Elbow / Hand / Wrist  Range of Motion Exercises Grip strengthening   REGIONAL ANESTHESIA (NERVE BLOCKS) The anesthesia team may have performed a nerve block for you if safe in the setting of your care.  This is a great tool used to minimize pain.  Typically the block may start wearing off overnight but the long acting medicine may last for 3-4 days.  The nerve block wearing off can be a challenging  period but please utilize your as needed pain medications to try and manage this period.    POST-OP MEDICATIONS- Multimodal approach to pain control In general your pain will be controlled with a combination of substances.  Prescriptions unless otherwise discussed are electronically sent to your pharmacy.  This is a carefully made plan we use to minimize narcotic use.     Meloxicam - Anti-inflammatory medication taken on a scheduled basis Acetaminophen - Non-narcotic pain medicine taken on a scheduled basis  Oxycodone - This is a strong narcotic, to be used only on an "as needed" basis for pain. Zofran -  take as needed for nausea  Celebrex - these are anti-inflammatory and pain relievers.  Do not take additional ibuprofen, naproxen or other NSAID while taking this medicine.   FOLLOW-UP If you develop a Fever (>101.5), Redness or Drainage from the surgical incision site, please call our office to arrange for an evaluation. Please call the office to schedule a follow-up appointment for a wound check, 7-10 days post-operatively.    HELPFUL INFORMATION  If you had a block, it will wear off between 8-24 hrs postop typically.  This is period when your pain may go from nearly zero to the pain you would have had postop without the block.  This is an abrupt transition but nothing dangerous is happening.  You may take an extra dose   of narcotic when this happens.  You should wean off your narcotic medicines as soon as you are able.  Most patients will be off or using minimal narcotics before their first postop appointment.   Elevating your leg will help with swelling and pain control.  You are encouraged to elevate your leg as much as possible in the first couple of weeks following surgery.  Imagine a drop of water on your toe, and your goal is to get that water back to your heart.  We suggest you use the pain medication the first night prior to going to bed, in order to ease any pain when the  anesthesia wears off. You should avoid taking pain medications on an empty stomach as it will make you nauseous.  Do not drink alcoholic beverages or take illicit drugs when taking pain medications.  In most states it is against the law to drive while you are in a splint or sling.  And certainly against the law to drive while taking narcotics.  You may return to work/school in the next couple of days when you feel up to it.   Pain medication may make you constipated.  Below are a few solutions to try in this order: Decrease the amount of pain medication if you aren't having pain. Drink lots of decaffeinated fluids. Drink prune juice and/or each dried prunes  If the first 3 don't work start with additional solutions Take Colace - an over-the-counter stool softener Take Senokot - an over-the-counter laxative Take Miralax - a stronger over-the-counter laxative   

## 2022-07-30 NOTE — Anesthesia Postprocedure Evaluation (Signed)
Anesthesia Post Note  Patient: Thomas Combs  Procedure(s) Performed: OPEN REDUCTION INTERNAL FIXATION (ORIF) CLAVICULAR FRACTURE (Right: Chest)  Patient location during evaluation: Phase II Anesthesia Type: General Level of consciousness: awake and alert and oriented Pain management: pain level controlled Vital Signs Assessment: post-procedure vital signs reviewed and stable Respiratory status: spontaneous breathing, nonlabored ventilation and respiratory function stable Cardiovascular status: blood pressure returned to baseline and stable Postop Assessment: no apparent nausea or vomiting Anesthetic complications: no  No notable events documented.   Last Vitals:  Vitals:   07/30/22 1200 07/30/22 1214  BP: (!) 139/98 (!) 135/91  Pulse: 100   Resp: 17   Temp:  (!) 36.4 C  SpO2: 97% 98%    Last Pain:  Vitals:   07/30/22 1214  TempSrc: Oral  PainSc: 0-No pain                 Jenaya Saar C Jesslynn Kruck

## 2022-07-30 NOTE — Transfer of Care (Signed)
Immediate Anesthesia Transfer of Care Note  Patient: Authur A Kraai  Procedure(s) Performed: OPEN REDUCTION INTERNAL FIXATION (ORIF) CLAVICULAR FRACTURE (Right: Chest)  Patient Location: PACU  Anesthesia Type:General  Level of Consciousness: awake, alert , oriented, and patient cooperative  Airway & Oxygen Therapy: Patient Spontanous Breathing and Patient connected to face mask oxygen  Post-op Assessment: Report given to RN, Post -op Vital signs reviewed and stable, and Patient moving all extremities X 4  Post vital signs: Reviewed and stable  Last Vitals:  Vitals Value Taken Time  BP 133/90 07/30/22 1115  Temp    Pulse 130 07/30/22 1126  Resp 27 07/30/22 1126  SpO2 96 % 07/30/22 1126  Vitals shown include unvalidated device data.  Last Pain:  Vitals:   07/30/22 0741  TempSrc: Oral  PainSc: 0-No pain         Complications: No notable events documented.

## 2022-07-30 NOTE — Anesthesia Preprocedure Evaluation (Signed)
Anesthesia Evaluation  Patient identified by MRN, date of birth, ID band Patient awake    Reviewed: Allergy & Precautions, H&P , NPO status , Patient's Chart, lab work & pertinent test results  Airway Mallampati: II  TM Distance: >3 FB Neck ROM: Full    Dental  (+) Dental Advisory Given, Missing, Chipped,    Pulmonary neg pulmonary ROS, Current Smoker   Pulmonary exam normal breath sounds clear to auscultation       Cardiovascular negative cardio ROS Normal cardiovascular exam Rhythm:Regular Rate:Normal     Neuro/Psych  PSYCHIATRIC DISORDERS  Depression  Schizophrenia   Neuromuscular disease (C/O Intermittent numbness in right forearm)    GI/Hepatic negative GI ROS,,,(+)     substance abuse  alcohol use  Endo/Other  negative endocrine ROS    Renal/GU negative Renal ROS  negative genitourinary   Musculoskeletal negative musculoskeletal ROS (+)    Abdominal   Peds negative pediatric ROS (+)  Hematology negative hematology ROS (+)   Anesthesia Other Findings   Reproductive/Obstetrics negative OB ROS                             Anesthesia Physical Anesthesia Plan  ASA: 2  Anesthesia Plan: General   Post-op Pain Management: Dilaudid IV   Induction: Intravenous  PONV Risk Score and Plan: 2 and Ondansetron, Dexamethasone and Midazolam  Airway Management Planned: Oral ETT  Additional Equipment:   Intra-op Plan:   Post-operative Plan: Extubation in OR  Informed Consent: I have reviewed the patients History and Physical, chart, labs and discussed the procedure including the risks, benefits and alternatives for the proposed anesthesia with the patient or authorized representative who has indicated his/her understanding and acceptance.     Dental advisory given  Plan Discussed with: CRNA and Surgeon  Anesthesia Plan Comments:        Anesthesia Quick Evaluation

## 2022-07-30 NOTE — Anesthesia Procedure Notes (Signed)
Procedure Name: Intubation Date/Time: 07/30/2022 9:09 AM  Performed by: Shanon Payor, CRNAPre-anesthesia Checklist: Patient identified, Emergency Drugs available, Suction available, Patient being monitored and Timeout performed Patient Re-evaluated:Patient Re-evaluated prior to induction Oxygen Delivery Method: Circle system utilized Preoxygenation: Pre-oxygenation with 100% oxygen Induction Type: IV induction Ventilation: Mask ventilation without difficulty Laryngoscope Size: Mac and 4 Grade View: Grade I Tube type: Oral Tube size: 7.5 mm Number of attempts: 1 Airway Equipment and Method: Stylet Placement Confirmation: ETT inserted through vocal cords under direct vision, positive ETCO2, CO2 detector and breath sounds checked- equal and bilateral Secured at: 23 cm Tube secured with: Tape Dental Injury: Teeth and Oropharynx as per pre-operative assessment

## 2022-07-30 NOTE — Interval H&P Note (Signed)
History and Physical Interval Note:  07/30/2022 8:51 AM  Thomas Combs  has presented today for surgery, with the diagnosis of RIght distal clavicle fracture.  The various methods of treatment have been discussed with the patient and family. After consideration of risks, benefits and other options for treatment, the patient has consented to  Procedure(s): OPEN REDUCTION INTERNAL FIXATION (ORIF) CLAVICULAR FRACTURE (Right) as a surgical intervention.  The patient's history has been reviewed, patient examined, no change in status, stable for surgery.  I have reviewed the patient's chart and labs.  Questions were answered to the patient's satisfaction.     Oliver Barre

## 2022-07-30 NOTE — Op Note (Signed)
Orthopaedic Surgery Operative Note (CSN: [id number])  Thomas Combs  1990-02-08 Date of Surgery: 07/30/2022   Diagnoses:  RIght distal clavicle fracture  Procedure: ORIF of right distal clavicle fracture   Operative Finding Successful completion of the planned procedure.  Fracture was reduced, with provisional fixation with a lag screw.  We then placed a superior clavicle plate, with distal locking screws.  This was reinforced with a tight rope through the coracoid.   Post-Op Diagnosis: Same Surgeons:Primary: Oliver Barre, MD Assistants: Westly Pam Location: [facility] OR ROOM 4 Anesthesia: General with local anesthesia Antibiotics: Ancef 2 g Tourniquet time: N/A Estimated Blood Loss: 200 cc Complications: None Specimens: None  Implants: Implant Name Type Inv. Item Serial No. Manufacturer Lot No. LRB No. Used Action  BUTTON DIST CLAVICLE PLATE TR - [id number] Anchor BUTTON DIST CLAVICLE PLATE TR  [employer] [id number] Right 1 Implanted  SCREW CORTEX LP TM SS 2.7X16 - SSTERILE ON SET Screw SCREW CORTEX LP TM SS 2.7X16 STERILE ON SET [employer]  Right 1 Implanted  SCREW LOW PROFILE 3.5X16 - SSTERILE ON SET Screw SCREW LOW PROFILE 3.5X16 STERILE ON SET [employer]  Right 3 Implanted    Indications for Surgery:   Thomas Combs is a 32 y.o. male who sustained a distal clavicle fracture.  Initially, the fracture appeared to be minimally displaced.  However, when he returned to clinic, there was superior displacement,, with indications that the CC ligaments were disrupted.  As such, recommended operative fixation, with stabilization of the CC ligaments.  Benefits and risks of operative and nonoperative management were discussed prior to surgery with the patient and informed consent form was completed.  Specific risks including infection, need for additional surgery, bleeding, nonunion, malunion, persistent pain, stiffness, numbness around the incision and more severe complications associate with  anesthesia were discussed.  All questions were answered.  Surgical consent was finalized.    Procedure:   The patient was identified properly. Informed consent was obtained and the surgical site was marked. The patient was taken to the OR where general anesthesia was induced.  The patient was positioned supine.  The right arm was prepped and draped in the usual sterile fashion.  Timeout was performed before the beginning of the case.  We used fluoroscopy to confirm the site of the fracture.  The coracoid was identified and marked.  We then carried out an incision directly over the anterior aspect of the clavicle extending distally over the shoulder.  We incised sharply through skin.  We dissected bluntly through subcutaneous tissue.  We then used Bovie cautery to dissect through the fascia, to the surface of the clavicle.  The clavicle was exposed medially, then extended laterally.  We are able to identify the fracture site.  We used a curette, rongeurs and irrigation to debride the callus that had formed.  The fracture was reducible.  This was confirmed under fluoroscopy.  There was a long oblique fracture, which were able to reduce.  We then proceeded to place a single lag screw to provide provisional fixation.  Plate was selected from the back table, which included a distal cluster of locking screws.  The screw was positioned on top of the clavicle, and a single screw was placed within the clavicle shaft, achieving excellent bicortical bite.  We then placed a single bicortical screw in the distal fracture fragment.  This secured the plate in place.  We then placed additional locking screws in the distal cluster.  At this point, we planned to backup our fixation with a tight rope.  The drill bit for the tight rope was then positioned directly over top of the coracoid.  We drilled quadricortical through the clavicle, and through the base of the coracoid.  Under direct visualization with fluoroscopy, the  button was passed through the base of the coracoid, and then flipped.  The button on top of the plate was then fashioned within the oblong hole.  This was secured in place with a hemostat.  We then sequentially tightened the tight rope sutures, to create excellent fixation.  A single half hitch was tied over top of the button.  Fluoroscopy confirmed appropriate positioning of the coracoid button, as well as the button through the plate.   Next, we proceeded to place additional bicortical screws in the shaft of the clavicle.  This was followed by multiple locking screws in the distal cluster.  Final fluoroscopic images demonstrates improved positioning of the fracture, with a superior plate, backed up with a button through the coracoid.  The lag screw was little prominent, but held good fixation.  This was not addressed.  Otherwise, screws were of appropriate length.  We irrigated the wound copiously.  We closed the incision in a multilayer fashion with absorbable suture.  Sterile dressing was placed followed by a sling.  Patient was awoken taken to PACU in stable condition.   Post-operative plan:  The patient will be NWB on the operative extremity Discharge home from the PACU once they have recovered DVT prophylaxis not indicated in this ambulatory upper extremity patient without significant risk factors.    Pain control with PRN pain medication preferring oral medicines.   Follow up plan will be scheduled in approximately 10-14 days for incision check and XR.

## 2022-08-01 ENCOUNTER — Encounter (HOSPITAL_COMMUNITY): Payer: Self-pay | Admitting: Emergency Medicine

## 2022-08-01 ENCOUNTER — Emergency Department (HOSPITAL_COMMUNITY)
Admission: EM | Admit: 2022-08-01 | Discharge: 2022-08-01 | Disposition: A | Discharge status: Home / Self Care | Payer: Self-pay | Attending: Student | Admitting: Student

## 2022-08-01 ENCOUNTER — Other Ambulatory Visit: Payer: Self-pay

## 2022-08-01 DIAGNOSIS — Z4800 Encounter for change or removal of nonsurgical wound dressing: Secondary | ICD-10-CM | POA: Insufficient documentation

## 2022-08-01 DIAGNOSIS — Z5189 Encounter for other specified aftercare: Secondary | ICD-10-CM

## 2022-08-01 DIAGNOSIS — F17210 Nicotine dependence, cigarettes, uncomplicated: Secondary | ICD-10-CM | POA: Insufficient documentation

## 2022-08-01 NOTE — ED Notes (Signed)
AVS provided to and discussed with patient and family member at bedside. Pt verbalizes understanding of discharge instructions and denies any questions or concerns at this time. Pt has ride home. Pt ambulated out of department independently with steady gait.  

## 2022-08-01 NOTE — ED Notes (Signed)
Dr. Posey Rea at bedside assessing patient.

## 2022-08-01 NOTE — ED Notes (Signed)
OR called for dressing replacement.

## 2022-08-01 NOTE — ED Notes (Signed)
Aquacell dressing placed to R shoulder, C/D/I on discharge.

## 2022-08-01 NOTE — ED Triage Notes (Signed)
Pt reports surgery on his right clavicle 2 days ago. Pt stating he is bleeding through the dressing. Pt called Dr. Dallas Schimke office and reports it does not open until 0800.

## 2022-08-01 NOTE — ED Provider Notes (Signed)
[place] EMERGENCY DEPARTMENT AT [facility] Provider Note  CSN: [id number] Arrival date & time: 08/01/22 0701  Chief Complaint(s) Post-op Problem  HPI BRODEE MAURITZ is a 32 y.o. male postop day 2 from a ORIF of a right clavicle who presents emergency room for evaluation of bleeding from the wound.  Patient woke up this morning noticing blood around his bandage and the orthopedic office was not open until later today and came to the ER for further evaluation.  Denies any chest pain, shortness of breath, Donnell pain, nausea, vomiting, lightheadedness or any other systemic symptoms.   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 Authorizing Provider  acetaminophen (TYLENOL) 500 MG tablet Take 2 tablets (1,000 mg total) by mouth every 8 (eight) hours for 14 days. 07/30/22 08/13/22  Oliver Barre, MD  meloxicam (MOBIC) 15 MG tablet Take 1 tablet (15 mg total) by mouth daily for 14 days. 07/30/22 08/13/22  Oliver Barre, MD  ondansetron (ZOFRAN) 4 MG tablet Take 1 tablet (4 mg total) by mouth every 8 (eight) hours as needed for up to 14 days for nausea or vomiting. 07/30/22 08/13/22  Oliver Barre, MD  oxyCODONE (ROXICODONE) 5 MG immediate release tablet Take 1 tablet (5 mg total) by mouth every 4 (four) hours as needed for up to 7 days. 07/30/22 08/06/22  Oliver Barre, MD                                                                                                                                    Past Surgical History History reviewed. No pertinent surgical history. Family  History Family History  Problem Relation Age of Onset   Diabetes Mother     Social History Social History   Tobacco Use   Smoking status: Every Day    Packs/day: .25    Types: Cigarettes   Smokeless tobacco: Never   Tobacco comments:    3-5 cig every 3 days  Vaping Use   Vaping Use: Never used  Substance Use Topics   Alcohol use: Yes    Comment: pt will not answer   Drug use: No    Comment: pt will not answer   Allergies Penicillins, Bee venom, and Strawberry (diagnostic)  Review of Systems Review of Systems  Skin:  Positive for wound.    Physical Exam Vital Signs  I have reviewed the triage vital signs BP 127/78   Pulse 65   Temp 98 F (36.7 C) (Oral)   Resp 18  SpO2 100%   Physical Exam Constitutional:      General: He is not in acute distress.    Appearance: Normal appearance.  HENT:     Head: Normocephalic and atraumatic.     Nose: No congestion or rhinorrhea.  Eyes:     General:        Right eye: No discharge.        Left eye: No discharge.     Extraocular Movements: Extraocular movements intact.     Pupils: Pupils are equal, round, and reactive to light.  Cardiovascular:     Rate and Rhythm: Normal rate and regular rhythm.     Heart sounds: No murmur heard. Pulmonary:     Effort: No respiratory distress.     Breath sounds: No wheezing or rales.  Abdominal:     General: There is no distension.     Tenderness: There is no abdominal tenderness.  Musculoskeletal:        General: Normal range of motion.     Cervical back: Normal range of motion.  Skin:    General: Skin is warm and dry.     Findings: Lesion present.  Neurological:     General: No focal deficit present.     Mental Status: He is alert.     ED Results and Treatments Labs (all labs ordered are listed, but only abnormal results are displayed) Labs Reviewed - No data to display                                                                                                                         Radiology No results found.  Pertinent labs & imaging results that were available during my care of the patient were reviewed by me and considered in my medical decision making (see MDM for details).  Medications Ordered in ED Medications - No data to display                                                                                                                                   Procedures Procedures  (including critical care time)  Medical Decision Making / ED Course   This patient presents to the ED for concern of wound check, this involves an extensive number of treatment options, and is a complaint that carries with it a high risk of complications and morbidity.  The differential diagnosis includes wound dehiscence, appropriate  postoperative oozing, coagulopathy, venous bleed, arterial bleed  MDM: Patient seen emergency room for evaluation of wound check.  Physical exam reveals an appropriately clotted surgical incision site with evidence of old oozing.  I did speak with Dr. Dallas Schimke of orthopedic surgery who states that this bandage can be replaced here in the emergency room and and he can follow-up outpatient.  The bandage was replaced and patient was discharged with outpatient orthopedic follow-up.  At this time does not meet inpatient criteria for admission.   Additional history obtained: -Additional history obtained from partner -External records from outside source obtained and reviewed including: Chart review including previous notes, labs, imaging, consultation notes    Medicines ordered and prescription drug management: No orders of the defined types were placed in this encounter.   -I have reviewed the patients home medicines and have made adjustments as needed  Critical interventions none  Consultations Obtained: I requested consultation with the orthopedic surgeon on-call Dr. Dallas Schimke,  and discussed lab and imaging findings as well as  pertinent plan - they recommend: Benadryl placement and outpatient follow-up   Cardiac Monitoring: The patient was maintained on a cardiac monitor.  I personally viewed and interpreted the cardiac monitored which showed an underlying rhythm of: NSR  Social Determinants of Health:  Factors impacting patients care include: none   Reevaluation: After the interventions noted above, I reevaluated the patient and found that they have :improved  Co morbidities that complicate the patient evaluation  Past Medical History:  Diagnosis Date   Medical history non-contributory    Schizophrenia (HCC)       Dispostion: I considered admission for this patient, but at this time he does not meet inpatient criteria for admission and he is safe for discharge with outpatient follow-up     Final Clinical Impression(s) / ED Diagnoses Final diagnoses:  None     @PCDICTATION @    Mckynna Vanloan, Wyn Forster, MD 08/01/22 [phone]

## 2022-08-02 ENCOUNTER — Encounter (HOSPITAL_COMMUNITY): Payer: Self-pay | Admitting: Orthopedic Surgery

## 2022-08-10 ENCOUNTER — Encounter: Payer: Self-pay | Admitting: Orthopedic Surgery

## 2022-08-15 ENCOUNTER — Encounter: Payer: Self-pay | Admitting: Orthopedic Surgery

## 2022-08-17 ENCOUNTER — Ambulatory Visit (INDEPENDENT_AMBULATORY_CARE_PROVIDER_SITE_OTHER): Payer: Self-pay | Admitting: Orthopedic Surgery

## 2022-08-17 ENCOUNTER — Ambulatory Visit (INDEPENDENT_AMBULATORY_CARE_PROVIDER_SITE_OTHER): Payer: Self-pay

## 2022-08-17 ENCOUNTER — Encounter: Payer: Self-pay | Admitting: Orthopedic Surgery

## 2022-08-17 DIAGNOSIS — S42031A Displaced fracture of lateral end of right clavicle, initial encounter for closed fracture: Secondary | ICD-10-CM

## 2022-08-17 NOTE — Patient Instructions (Signed)
Pendulum   Stand near a wall or a surface that you can hold onto for balance. Bend at the waist and let your left / right arm hang straight down. Use your other arm to support you. Keep your back straight and do not lock your knees. Relax your left / right arm and shoulder muscles, and move your hips and your trunk so your left / right arm swings freely. Your arm should swing because of the motion of your body, not because you are using your arm or shoulder muscles. Keep moving your hips and trunk so your arm swings in the following directions, as told by your health care provider: Side to side. Forward and backward. In clockwise and counterclockwise circles. Continue each motion for 20 seconds, or for as long as told by your health care provider. Slowly return to the starting position.  Repeat 10 times. Complete this exercise daily.  
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
## 2022-09-07 ENCOUNTER — Encounter: Payer: Self-pay | Admitting: Orthopedic Surgery

## 2022-09-18 ENCOUNTER — Encounter: Payer: Self-pay | Admitting: Orthopedic Surgery

## 2023-11-07 ENCOUNTER — Other Ambulatory Visit: Payer: Self-pay

## 2023-11-07 ENCOUNTER — Encounter (HOSPITAL_COMMUNITY): Payer: Self-pay

## 2023-11-07 ENCOUNTER — Emergency Department (HOSPITAL_COMMUNITY)
Admission: EM | Admit: 2023-11-07 | Discharge: 2023-11-07 | Disposition: A | Discharge status: Home / Self Care | Payer: Self-pay | Attending: Emergency Medicine | Admitting: Emergency Medicine

## 2023-11-07 DIAGNOSIS — W458XXA Other foreign body or object entering through skin, initial encounter: Secondary | ICD-10-CM | POA: Insufficient documentation

## 2023-11-07 DIAGNOSIS — S30850A Superficial foreign body of lower back and pelvis, initial encounter: Secondary | ICD-10-CM | POA: Insufficient documentation

## 2023-11-07 DIAGNOSIS — S80851A Superficial foreign body, right lower leg, initial encounter: Secondary | ICD-10-CM

## 2023-11-07 DIAGNOSIS — Y9319 Activity, other involving water and watercraft: Secondary | ICD-10-CM | POA: Insufficient documentation

## 2023-11-07 MED ORDER — CEPHALEXIN 500 MG PO CAPS: 500.0000 mg | ORAL_CAPSULE | Freq: Three times a day (TID) | ORAL | 0 refills | Status: AC

## 2023-11-07 MED ORDER — LEVOFLOXACIN 750 MG PO TABS
750.0000 mg | ORAL_TABLET | Freq: Once | ORAL | Status: AC
  Administered 2023-11-07: 750 mg via ORAL
  Filled 2023-11-07: qty 1

## 2023-11-07 MED ORDER — CEPHALEXIN 500 MG PO CAPS
500.0000 mg | ORAL_CAPSULE | Freq: Once | ORAL | Status: AC
  Administered 2023-11-07: 500 mg via ORAL
  Filled 2023-11-07: qty 1

## 2023-11-07 MED ORDER — LIDOCAINE HCL (PF) 1 % IJ SOLN
30.0000 mL | Freq: Once | INTRAMUSCULAR | Status: AC
  Administered 2023-11-07: 30 mL
  Filled 2023-11-07: qty 30

## 2023-11-07 MED ORDER — TETANUS-DIPHTH-ACELL PERTUSSIS 5-2-15.5 LF-MCG/0.5 IM SUSP: 0.5000 mL | Freq: Once | INTRAMUSCULAR | Status: DC

## 2023-11-07 MED ORDER — LEVOFLOXACIN 750 MG PO TABS: 750.0000 mg | ORAL_TABLET | Freq: Every day | ORAL | 0 refills | Status: AC
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
## 2023-11-07 NOTE — ED Triage Notes (Signed)
 Complaining of having a fishing hook in the right buttocks.
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
## 2023-11-07 NOTE — ED Notes (Signed)
 Gauze dressing applied to wound on right lower buttock.

## 2024-01-17 ENCOUNTER — Encounter (HOSPITAL_COMMUNITY): Payer: Self-pay | Admitting: Emergency Medicine

## 2024-01-17 ENCOUNTER — Emergency Department (HOSPITAL_COMMUNITY)
Admission: EM | Admit: 2024-01-17 | Discharge: 2024-01-17 | Disposition: A | Discharge status: Home / Self Care | Payer: Self-pay | Attending: Emergency Medicine | Admitting: Emergency Medicine

## 2024-01-17 ENCOUNTER — Other Ambulatory Visit: Payer: Self-pay

## 2024-01-17 ENCOUNTER — Emergency Department (HOSPITAL_COMMUNITY): Payer: Self-pay

## 2024-01-17 DIAGNOSIS — S43401A Unspecified sprain of right shoulder joint, initial encounter: Secondary | ICD-10-CM | POA: Insufficient documentation

## 2024-01-17 DIAGNOSIS — M7989 Other specified soft tissue disorders: Secondary | ICD-10-CM | POA: Insufficient documentation

## 2024-01-17 DIAGNOSIS — X501XXA Overexertion from prolonged static or awkward postures, initial encounter: Secondary | ICD-10-CM | POA: Insufficient documentation

## 2024-01-17 MED ORDER — IBUPROFEN 600 MG PO TABS: 600.0000 mg | ORAL_TABLET | Freq: Four times a day (QID) | ORAL | 0 refills | Status: AC | PRN
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
## 2024-01-17 NOTE — ED Notes (Signed)
 Shoulder sling/immobilizer applied to right arm
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
